# Patient Record
Sex: Female | Born: 1985 | Race: Black or African American | Hispanic: No | Marital: Single | Smoking: Never smoker
Health system: Southern US, Community
[De-identification: ages and names within clinical notes are randomized; demographics above are authoritative.]

## PROBLEM LIST (undated history)

## (undated) DIAGNOSIS — F32A Depression, unspecified: Secondary | ICD-10-CM

## (undated) DIAGNOSIS — F419 Anxiety disorder, unspecified: Secondary | ICD-10-CM

## (undated) DIAGNOSIS — Z9151 Personal history of suicidal behavior: Secondary | ICD-10-CM

## (undated) DIAGNOSIS — Z915 Personal history of self-harm: Secondary | ICD-10-CM

## (undated) DIAGNOSIS — F329 Major depressive disorder, single episode, unspecified: Secondary | ICD-10-CM

## (undated) HISTORY — PX: MOUTH SURGERY: SHX715

---
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
## 2005-06-03 ENCOUNTER — Other Ambulatory Visit: Admission: RE | Admit: 2005-06-03 | Discharge: 2005-06-03 | Payer: Self-pay | Admitting: Family Medicine

## 2005-06-03 ENCOUNTER — Ambulatory Visit: Payer: Self-pay | Admitting: Family Medicine

## 2005-09-04 ENCOUNTER — Emergency Department (HOSPITAL_COMMUNITY): Admission: EM | Admit: 2005-09-04 | Discharge: 2005-09-04 | Payer: Self-pay | Admitting: Family Medicine

## 2005-11-08 ENCOUNTER — Emergency Department (HOSPITAL_COMMUNITY): Admission: EM | Admit: 2005-11-08 | Discharge: 2005-11-08 | Payer: Self-pay | Admitting: Family Medicine

## 2006-08-24 ENCOUNTER — Ambulatory Visit: Payer: Self-pay | Admitting: Family Medicine

## 2006-09-02 ENCOUNTER — Ambulatory Visit: Payer: Self-pay | Admitting: Family Medicine

## 2006-09-03 ENCOUNTER — Encounter: Admission: RE | Admit: 2006-09-03 | Discharge: 2006-09-03 | Payer: Self-pay | Admitting: Family Medicine

## 2006-09-14 ENCOUNTER — Emergency Department (HOSPITAL_COMMUNITY): Admission: EM | Admit: 2006-09-14 | Discharge: 2006-09-14 | Payer: Self-pay | Admitting: Family Medicine

## 2006-09-28 ENCOUNTER — Ambulatory Visit: Payer: Self-pay | Admitting: Family Medicine

## 2006-09-30 ENCOUNTER — Ambulatory Visit: Payer: Self-pay | Admitting: Family Medicine

## 2006-11-12 ENCOUNTER — Emergency Department (HOSPITAL_COMMUNITY): Admission: EM | Admit: 2006-11-12 | Discharge: 2006-11-12 | Payer: Self-pay | Admitting: Family Medicine

## 2007-03-15 ENCOUNTER — Ambulatory Visit: Payer: Self-pay | Admitting: Family Medicine

## 2007-04-19 DIAGNOSIS — I10 Essential (primary) hypertension: Secondary | ICD-10-CM | POA: Insufficient documentation

## 2007-04-19 DIAGNOSIS — G039 Meningitis, unspecified: Secondary | ICD-10-CM | POA: Insufficient documentation

## 2007-06-30 ENCOUNTER — Ambulatory Visit: Payer: Self-pay | Admitting: Family Medicine

## 2007-08-26 ENCOUNTER — Emergency Department (HOSPITAL_COMMUNITY): Admission: EM | Admit: 2007-08-26 | Discharge: 2007-08-26 | Payer: Self-pay | Admitting: Family Medicine

## 2008-01-25 ENCOUNTER — Emergency Department (HOSPITAL_COMMUNITY): Admission: EM | Admit: 2008-01-25 | Discharge: 2008-01-25 | Payer: Self-pay | Admitting: Emergency Medicine

## 2008-10-31 ENCOUNTER — Emergency Department (HOSPITAL_COMMUNITY): Admission: EM | Admit: 2008-10-31 | Discharge: 2008-10-31 | Payer: Self-pay | Admitting: Emergency Medicine

## 2009-07-01 ENCOUNTER — Emergency Department (HOSPITAL_COMMUNITY): Admission: EM | Admit: 2009-07-01 | Discharge: 2009-07-01 | Payer: Self-pay | Admitting: Emergency Medicine

## 2009-08-19 ENCOUNTER — Telehealth (INDEPENDENT_AMBULATORY_CARE_PROVIDER_SITE_OTHER): Payer: Self-pay | Admitting: *Deleted

## 2010-10-19 ENCOUNTER — Emergency Department (HOSPITAL_COMMUNITY)
Admission: EM | Admit: 2010-10-19 | Discharge: 2010-10-20 | Payer: Self-pay | Source: Home / Self Care | Admitting: Emergency Medicine

## 2011-01-18 ENCOUNTER — Encounter: Payer: Self-pay | Admitting: Family Medicine

## 2011-02-23 ENCOUNTER — Emergency Department (HOSPITAL_COMMUNITY)
Admission: EM | Admit: 2011-02-23 | Discharge: 2011-02-23 | Disposition: A | Discharge status: Home / Self Care | Payer: PRIVATE HEALTH INSURANCE | Attending: Emergency Medicine | Admitting: Emergency Medicine

## 2011-02-23 ENCOUNTER — Emergency Department (HOSPITAL_COMMUNITY): Payer: PRIVATE HEALTH INSURANCE

## 2011-02-23 DIAGNOSIS — R110 Nausea: Secondary | ICD-10-CM | POA: Insufficient documentation

## 2011-02-23 DIAGNOSIS — E119 Type 2 diabetes mellitus without complications: Secondary | ICD-10-CM | POA: Insufficient documentation

## 2011-02-23 DIAGNOSIS — I10 Essential (primary) hypertension: Secondary | ICD-10-CM | POA: Insufficient documentation

## 2011-02-23 DIAGNOSIS — R071 Chest pain on breathing: Secondary | ICD-10-CM | POA: Insufficient documentation

## 2011-02-23 LAB — URINALYSIS, ROUTINE W REFLEX MICROSCOPIC
Bilirubin Urine: NEGATIVE
Ketones, ur: NEGATIVE mg/dL
Nitrite: NEGATIVE
Protein, ur: NEGATIVE mg/dL
Specific Gravity, Urine: 1.02 (ref 1.005–1.030)
Urobilinogen, UA: 1 mg/dL (ref 0.0–1.0)

## 2011-02-23 LAB — POCT PREGNANCY, URINE

## 2011-02-27 ENCOUNTER — Emergency Department (HOSPITAL_COMMUNITY): Payer: PRIVATE HEALTH INSURANCE

## 2011-02-27 ENCOUNTER — Emergency Department (HOSPITAL_COMMUNITY)
Admission: EM | Admit: 2011-02-27 | Discharge: 2011-02-27 | Disposition: A | Discharge status: Home / Self Care | Payer: PRIVATE HEALTH INSURANCE | Attending: Emergency Medicine | Admitting: Emergency Medicine

## 2011-02-27 ENCOUNTER — Encounter (HOSPITAL_COMMUNITY): Payer: Self-pay

## 2011-02-27 DIAGNOSIS — I10 Essential (primary) hypertension: Secondary | ICD-10-CM | POA: Insufficient documentation

## 2011-02-27 DIAGNOSIS — E119 Type 2 diabetes mellitus without complications: Secondary | ICD-10-CM | POA: Insufficient documentation

## 2011-02-27 DIAGNOSIS — R110 Nausea: Secondary | ICD-10-CM | POA: Insufficient documentation

## 2011-02-27 DIAGNOSIS — R51 Headache: Secondary | ICD-10-CM | POA: Insufficient documentation

## 2011-03-23 ENCOUNTER — Inpatient Hospital Stay (INDEPENDENT_AMBULATORY_CARE_PROVIDER_SITE_OTHER)
Admission: RE | Admit: 2011-03-23 | Discharge: 2011-03-23 | Disposition: A | Discharge status: Home / Self Care | Payer: PRIVATE HEALTH INSURANCE | Source: Ambulatory Visit | Attending: Emergency Medicine | Admitting: Emergency Medicine

## 2011-03-23 DIAGNOSIS — M799 Soft tissue disorder, unspecified: Secondary | ICD-10-CM

## 2011-04-05 LAB — POCT RAPID STREP A (OFFICE): Streptococcus, Group A Screen (Direct): NEGATIVE
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
## 2011-06-11 ENCOUNTER — Emergency Department (HOSPITAL_BASED_OUTPATIENT_CLINIC_OR_DEPARTMENT_OTHER)
Admission: EM | Admit: 2011-06-11 | Discharge: 2011-06-11 | Disposition: A | Discharge status: Home / Self Care | Payer: PRIVATE HEALTH INSURANCE | Attending: Emergency Medicine | Admitting: Emergency Medicine

## 2011-06-11 DIAGNOSIS — J020 Streptococcal pharyngitis: Secondary | ICD-10-CM | POA: Insufficient documentation

## 2011-06-11 DIAGNOSIS — I10 Essential (primary) hypertension: Secondary | ICD-10-CM | POA: Insufficient documentation

## 2011-06-30 ENCOUNTER — Emergency Department (HOSPITAL_COMMUNITY): Payer: PRIVATE HEALTH INSURANCE

## 2011-06-30 ENCOUNTER — Emergency Department (HOSPITAL_COMMUNITY)
Admission: EM | Admit: 2011-06-30 | Discharge: 2011-06-30 | Disposition: A | Discharge status: Home / Self Care | Payer: PRIVATE HEALTH INSURANCE | Attending: Emergency Medicine | Admitting: Emergency Medicine

## 2011-06-30 DIAGNOSIS — R079 Chest pain, unspecified: Secondary | ICD-10-CM | POA: Insufficient documentation

## 2011-06-30 DIAGNOSIS — Z79899 Other long term (current) drug therapy: Secondary | ICD-10-CM | POA: Insufficient documentation

## 2011-06-30 DIAGNOSIS — I10 Essential (primary) hypertension: Secondary | ICD-10-CM | POA: Insufficient documentation

## 2011-07-05 ENCOUNTER — Emergency Department (HOSPITAL_COMMUNITY)
Admission: EM | Admit: 2011-07-05 | Discharge: 2011-07-05 | Disposition: A | Discharge status: Home / Self Care | Payer: PRIVATE HEALTH INSURANCE | Attending: Emergency Medicine | Admitting: Emergency Medicine

## 2011-07-05 DIAGNOSIS — Y92009 Unspecified place in unspecified non-institutional (private) residence as the place of occurrence of the external cause: Secondary | ICD-10-CM | POA: Insufficient documentation

## 2011-07-05 DIAGNOSIS — S0990XA Unspecified injury of head, initial encounter: Secondary | ICD-10-CM | POA: Insufficient documentation

## 2011-07-05 DIAGNOSIS — W1809XA Striking against other object with subsequent fall, initial encounter: Secondary | ICD-10-CM | POA: Insufficient documentation

## 2011-07-05 DIAGNOSIS — W208XXA Other cause of strike by thrown, projected or falling object, initial encounter: Secondary | ICD-10-CM | POA: Insufficient documentation

## 2011-07-17 ENCOUNTER — Emergency Department (HOSPITAL_COMMUNITY)
Admission: EM | Admit: 2011-07-17 | Discharge: 2011-07-17 | Disposition: A | Discharge status: Home / Self Care | Payer: PRIVATE HEALTH INSURANCE | Attending: Emergency Medicine | Admitting: Emergency Medicine

## 2011-07-17 DIAGNOSIS — N644 Mastodynia: Secondary | ICD-10-CM | POA: Insufficient documentation

## 2011-07-17 DIAGNOSIS — X58XXXA Exposure to other specified factors, initial encounter: Secondary | ICD-10-CM | POA: Insufficient documentation

## 2011-07-17 DIAGNOSIS — S2000XA Contusion of breast, unspecified breast, initial encounter: Secondary | ICD-10-CM | POA: Insufficient documentation

## 2011-07-17 DIAGNOSIS — I10 Essential (primary) hypertension: Secondary | ICD-10-CM | POA: Insufficient documentation

## 2012-06-06 ENCOUNTER — Emergency Department (HOSPITAL_COMMUNITY)
Admission: EM | Admit: 2012-06-06 | Discharge: 2012-06-06 | Disposition: A | Discharge status: Home / Self Care | Payer: PRIVATE HEALTH INSURANCE | Attending: Emergency Medicine | Admitting: Emergency Medicine

## 2012-06-06 ENCOUNTER — Encounter (HOSPITAL_COMMUNITY): Payer: Self-pay | Admitting: Emergency Medicine

## 2012-06-06 ENCOUNTER — Emergency Department (HOSPITAL_COMMUNITY): Admission: EM | Admit: 2012-06-06 | Payer: Self-pay | Source: Home / Self Care

## 2012-06-06 DIAGNOSIS — H5710 Ocular pain, unspecified eye: Secondary | ICD-10-CM | POA: Insufficient documentation

## 2012-06-06 MED ORDER — FLUORESCEIN SODIUM 1 MG OP STRP: 2.0000 | ORAL_STRIP | Freq: Once | OPHTHALMIC | Status: DC

## 2012-06-06 MED ORDER — TETRACAINE HCL 0.5 % OP SOLN: 2.0000 [drp] | Freq: Once | OPHTHALMIC | Status: DC

## 2012-06-06 NOTE — ED Notes (Signed)
Pt c/o eye pain that began "10-15 minutes ago". Pt states she suddenly felt a sharp pain in left eye. Pt looked in mirror and eye was blood shot. Pt states the redness has gone down but the pain is still present.

## 2012-06-06 NOTE — Discharge Instructions (Signed)
You were seen and evaluated for your complaints of left eye pain. Your providers today were not able to find any cause for your eye pain. There were no signs for any emergent condition causing your eye pain. At this time your providers feel you may return home and followup with an ophthalmology specialist. Please call Dr. Ephriam Knuckles office later today to schedule close followup appointment. If you are unable to get appointment or your symptoms worsen, you have changing or loss of vision, you have increased redness of the eye at work or discharge please return to the emergency room.   Pain of Unknown Etiology (Pain Without a Known Cause) You have come to your caregiver because of pain. Pain can occur in any part of the body. Often there is not a definite cause. If your laboratory (blood or urine) work was normal and x-rays or other studies were normal, your caregiver may treat you without knowing the cause of the pain. An example of this is the headache. Most headaches are diagnosed by taking a history. This means your caregiver asks you questions about your headaches. Your caregiver determines a treatment based on your answers. Usually testing done for headaches is normal. Often testing is not done unless there is no response to medications. Regardless of where your pain is located today, you can be given medications to make you comfortable. If no physical cause of pain can be found, most cases of pain will gradually leave as suddenly as they came.  If you have a painful condition and no reason can be found for the pain, It is importantthat you follow up with your caregiver. If the pain becomes worse or does not go away, it may be necessary to repeat tests and look further for a possible cause.  Only take over-the-counter or prescription medicines for pain, discomfort, or fever as directed by your caregiver.   For the protection of your privacy, test results can not be given over the phone. Make sure you  receive the results of your test. Ask as to how these results are to be obtained if you have not been informed. It is your responsibility to obtain your test results.   You may continue all activities unless the activities cause more pain. When the pain lessens, it is important to gradually resume normal activities. Resume activities by beginning slowly and gradually increasing the intensity and duration of the activities or exercise. During periods of severe pain, bed-rest may be helpful. Lay or sit in any position that is comfortable.   Ice used for acute (sudden) conditions may be effective. Use a large plastic bag filled with ice and wrapped in a towel. This may provide pain relief.   See your caregiver for continued problems. They can help or refer you for exercises or physical therapy if necessary.  If you were given medications for your condition, do not drive, operate machinery or power tools, or sign legal documents for 24 hours. Do not drink alcohol, take sleeping pills, or take other medications that may interfere with treatment. See your caregiver immediately if you have pain that is becoming worse and not relieved by medications. Document Released: 09/08/2001 Document Revised: 12/03/2011 Document Reviewed: 12/14/2005 [facility] Patient Information 2012 [place], [place].    RESOURCE GUIDE  Chronic Pain Problems: Contact [facility]  [phone] Patients need to be referred by their primary care doctor.  Insufficient Money for Medicine: Contact United Way:  call "211" or [facility] (212)821-4758.  No Primary Care Doctor: - Call [facility] Connect  ([phone] - can help you locate a primary care doctor that  accepts your insurance, provides certain services, etc. - Physician Referral Service- [phone]  Agencies that provide inexpensive medical care: - [facility]  [phone] - [facility]  [phone] - [facility]  [phone] - Women's Clinic  [phone] - Planned Parenthood  [phone] [facility]  [phone]  Medicaid-accepting [place] [facility] Providers: - [facility]- [address]  [phone], Mon-Fri 9am-7pm, Sat 9am-1pm - [facility]- [address] [place], [address] [place]  [phone] - [facility]- [address] [place]  [phone] [facility]- [address]  [phone] - Renaye Rakers- [address] [place], [address], [phone]  Only accepts [place] patients after they have their name  applied to their card  Self Pay (no insurance) in [place]: - Sickle Cell Patients: Dr Willey Blade, [facility]  [address] [place], [phone] - [facility]- [address] [place]  [phone]       [facility] [place]- [address]       -     [facility]- see information above (Speak to [employer] if you do not have insurance)       -  [facility]- [address] [place], [phone]       -  [facility]- [address],  [phone]       -  [facility]- [address] [phone]       -  Dr Julio Sicks-  [address], [place], [phone]       -  [facility]- [address] [phone]       -  [facility]- [address] [phone], also [address] [phone]       -    [facility]- [address] [place], [phone], 1st & 3rd Saturday   every month, 10am-1pm  1) Find a Doctor and Pay Out of Pocket Although you won't have to find out who is covered by your insurance plan, it is a good idea to ask around and get recommendations. You will then need to call the office and see if the doctor you have chosen will accept you as a new patient and what types of options they offer for patients who are self-pay. Some doctors offer discounts or will  set up payment plans for their patients who do not have insurance, but you will need to ask so you aren't surprised when you get to your appointment.  2) Contact Your Local Health Department Not all health departments have doctors that can see patients for sick visits, but many do, so it is worth a call to see if yours does. If you don't know where your local health department is, you can check in your phone book. The CDC also has a tool to help you locate your state's health department, and many state websites also have listings of all of their local health departments.  3) Find a Walk-in Clinic If your illness is not likely to be very severe or complicated, you may want to try a walk in clinic. These are popping up all  over the country in pharmacies, drugstores, and shopping centers. They're usually staffed by nurse practitioners or physician assistants that have been trained to treat common illnesses and complaints. They're usually fairly quick and inexpensive. However, if you have serious medical issues or chronic medical problems, these are probably not your best option  STD Testing - [facility] Department of [facility], STD Clinic, [address], [place], phone [phone] or [phone].  Monday - Friday, call for an appointment. [facility] Department of [employer], STD Clinic, [address], [place], phone [phone] or [phone].  Monday - Friday, call for an appointment.  Abuse/Neglect: [facility] Child Abuse Hotline 8677307919 [facility] Child Abuse Hotline 4181316185 (After Hours)  Emergency Shelter:  [facility] (314)562-8747  Maternity Homes: - [facility] at [facility] [place] [facility] (747)535-1293 - [facility] (684)774-5307  MRSA Hotline #:   978-247-0040  [facility] Resources  [facility] [place]  United Way [facility] Dept. [address]                  [address]         [address] [place] [address]  Blondell Reveal Phone:  [phone]                                  Phone:  [phone]                   Phone:  [phone]  [facility] Mental Health, [phone] - [phone]       -     [facility] in [place], [address],                                  [phone], [facility] Child Abuse Hotline (279)601-2377 or 657-438-3328 (After Hours)   Behavioral Health Services  Substance Abuse Resources: - Alcohol and Drug Services  303-285-8067 - [facility] 856-245-5375 - [facility] 4702844074 [place]ene Flock [phone] - Residential & Outpatient Substance Abuse Program  [phone]  Psychological Services: [facility]  (775)[phone] Services  ([phone] - [facility], [phone] [place]. [address], [place], ACCESS LINE: (716)845-1288 or 470-706-2365, [employer]za  Dental Assistance  If unable to pay or uninsured, contact:  [facility] or [facility]. to become qualified for the adult dental clinic.  Patients with Medicaid: [facility] (424)[phone] [address], [phone] [address], [phone]  If unable to pay, or uninsured, contact [facility] 484-197-5124) or [facility] Department [phone] in [place], [phone] in [facility]) to become qualified for the adult dental clinic  Other Proofreader Services: - [facility]- [address], [place]  [place], [place], [address], [phone], [phone], 2nd and 4th Thursday of the month at 6:30am.  10 clients each day by appointment, can sometimes see walk-in patients if someone does not show for an appointment. [facility]-  [address] Ether Griffins [place], [place], [address], [phone] - [facility]- [address], [place], [place], [address], [phone] - [facility]- [phone] [facility] Health Department- [phone] [facility]- [phone]

## 2012-06-06 NOTE — ED Provider Notes (Signed)
Medical screening examination/treatment/procedure(s) were performed by non-physician practitioner and as supervising physician I was immediately available for consultation/collaboration.  Olivia Mackie, MD 06/06/12 [phone]

## 2012-06-06 NOTE — ED Notes (Signed)
Pt states that approximately 1/2 hour ago she was walking and felt a sharp stabbing pain in her Lt eye and her eye immediately became bloodshot. Pt states that the redness has cleared; however, the pain still remains. Pt denies any trauma or chemical exposure or smoke exposure. Pt denies any vision changes. "It just hurts."

## 2012-06-06 NOTE — ED Notes (Signed)
MD at bedside. 

## 2012-06-06 NOTE — ED Notes (Signed)
Eye care box containing tetracaine drops & fluorecein strips placed at bedside.

## 2012-06-06 NOTE — ED Provider Notes (Signed)
History     CSN: [id number]  Arrival date & time 06/06/12  0203   First MD Initiated Contact with Patient 06/06/12 [id number]      Chief Complaint  Patient presents with  . Eye Pain    HPI  History provided by the patient. Patient is a 26 year old female with no significant past medical history who presents with complaints of acute onset left eye pain and redness. Patient states symptoms began just prior to arrival to the emergency room. Patient was walking outside with friends and suddenly had sharp pains to the left eye. Patient states she looked in the mirror and noticed redness to the lateral aspect of the high states he was very "bloodshot". This has improved the patient continues to have some pain to the eye. She denies any visual change. There is no blurriness or loss of vision. Patient denies having any foreign body too high. She denies any itching or scratching to the eye. She denies any similar symptoms previously. She has not done anything to help with symptoms. She denies any other aggravating or alleviating factors. Symptoms are described as moderate.    History reviewed. No pertinent past medical history.  History reviewed. No pertinent past surgical history.  History reviewed. No pertinent family history.  History  Substance Use Topics  . Smoking status: Not on file  . Smokeless tobacco: Not on file  . Alcohol Use: Not on file    OB History    Grav Para Term Preterm Abortions TAB SAB Ect Mult Living                  Review of Systems  Eyes: Positive for photophobia, pain and redness. Negative for discharge, itching and visual disturbance.  Gastrointestinal: Negative for nausea.  Neurological: Negative for dizziness, light-headedness and headaches.    Allergies  Review of patient's allergies indicates no known allergies.  Home Medications  No current outpatient prescriptions on file.  BP 116/74  Pulse 67  Temp(Src) 98.4 F (36.9 C) (Oral)  Resp 18  SpO2  99%  Physical Exam  Nursing note and vitals reviewed. Constitutional: She is oriented to person, place, and time. She appears well-developed and well-nourished. No distress.  HENT:  Head: Normocephalic and atraumatic.  Mouth/Throat: Oropharynx is clear and moist.  Eyes: Conjunctivae, EOM and lids are normal. Pupils are equal, round, and reactive to light. No foreign bodies found. Right eye exhibits no chemosis and no discharge. No foreign body present in the right eye. Left eye exhibits no chemosis and no discharge. No foreign body present in the left eye. Right conjunctiva is not injected. Right conjunctiva has no hemorrhage. Left conjunctiva is not injected. Left conjunctiva has no hemorrhage.  Slit lamp exam:      The right eye shows no corneal abrasion, no corneal ulcer, no foreign body, no hyphema, no fluorescein uptake and no anterior chamber bulge.       The left eye shows no corneal abrasion, no corneal ulcer, no foreign body, no hyphema, no fluorescein uptake and no anterior chamber bulge.       Ocular pressures were:  Right eye: 17 and 17  Left eye: 20, 16 and 16  Neck: Normal range of motion. Neck supple.  Cardiovascular: Normal rate and regular rhythm.   Pulmonary/Chest: Effort normal and breath sounds normal.  Neurological: She is alert and oriented to person, place, and time.  Skin: Skin is warm and dry.  Psychiatric: She has a normal mood and affect.  Her behavior is normal.    ED Course  Procedures      1. Eye pain       MDM  Patient seen and evaluated. Patient in no acute distress.   No concerning findings on exam to explain patient's pain and symptoms. Will give ophthalmology referral for patient to followup with today if symptoms persist.    Angus Seller, PA 06/06/12 [phone]

## 2012-10-24 ENCOUNTER — Encounter (HOSPITAL_COMMUNITY): Payer: Self-pay | Admitting: Emergency Medicine

## 2012-10-24 ENCOUNTER — Emergency Department (HOSPITAL_COMMUNITY)
Admission: EM | Admit: 2012-10-24 | Discharge: 2012-10-25 | Disposition: A | Discharge status: Other Acute Inpatient Hospital | Payer: PRIVATE HEALTH INSURANCE | Attending: Emergency Medicine | Admitting: Emergency Medicine

## 2012-10-24 DIAGNOSIS — F3289 Other specified depressive episodes: Secondary | ICD-10-CM | POA: Insufficient documentation

## 2012-10-24 DIAGNOSIS — F329 Major depressive disorder, single episode, unspecified: Secondary | ICD-10-CM | POA: Insufficient documentation

## 2012-10-24 HISTORY — DX: Personal history of self-harm: Z91.5

## 2012-10-24 HISTORY — DX: Personal history of suicidal behavior: Z91.51

## 2012-10-24 LAB — CBC
HCT: 38.5 % (ref 36.0–46.0)
Hemoglobin: 13 g/dL (ref 12.0–15.0)
MCH: 26.1 pg (ref 26.0–34.0)
MCHC: 33.8 g/dL (ref 30.0–36.0)
MCV: 77.2 fL — ABNORMAL LOW (ref 78.0–100.0)
RBC: 4.99 MIL/uL (ref 3.87–5.11)

## 2012-10-24 LAB — COMPREHENSIVE METABOLIC PANEL
BUN: 10 mg/dL (ref 6–23)
CO2: 26 mEq/L (ref 19–32)
Calcium: 9.6 mg/dL (ref 8.4–10.5)
Creatinine, Ser: 0.71 mg/dL (ref 0.50–1.10)
GFR calc Af Amer: >90 mL/min (ref >90)
GFR calc non Af Amer: >90 mL/min (ref >90)
Glucose, Bld: 125 mg/dL — ABNORMAL HIGH (ref 70–99)
Total Protein: 8.5 g/dL — ABNORMAL HIGH (ref 6.0–8.3)

## 2012-10-24 LAB — RAPID URINE DRUG SCREEN, HOSP PERFORMED
Barbiturates: NOT DETECTED
Benzodiazepines: NOT DETECTED
Cocaine: NOT DETECTED

## 2012-10-24 LAB — ETHANOL: Alcohol, Ethyl (B): <11 mg/dL (ref 0–11)

## 2012-10-24 MED ORDER — ONDANSETRON HCL 4 MG PO TABS: 4.0000 mg | ORAL_TABLET | Freq: Three times a day (TID) | ORAL | Status: DC | PRN

## 2012-10-24 MED ORDER — ZOLPIDEM TARTRATE 5 MG PO TABS: 5.0000 mg | ORAL_TABLET | Freq: Every evening | ORAL | Status: DC | PRN

## 2012-10-24 MED ORDER — LORAZEPAM 1 MG PO TABS: 1.0000 mg | ORAL_TABLET | Freq: Three times a day (TID) | ORAL | Status: DC | PRN

## 2012-10-24 MED ORDER — ALUM & MAG HYDROXIDE-SIMETH 200-200-20 MG/5ML PO SUSP: 30.0000 mL | ORAL | Status: DC | PRN

## 2012-10-24 MED ORDER — NICOTINE 21 MG/24HR TD PT24
21.0000 mg | MEDICATED_PATCH | Freq: Every day | TRANSDERMAL | Status: DC
  Filled 2012-10-24: qty 1

## 2012-10-24 MED ORDER — IBUPROFEN 600 MG PO TABS: 600.0000 mg | ORAL_TABLET | Freq: Three times a day (TID) | ORAL | Status: DC | PRN

## 2012-10-24 MED ORDER — ACETAMINOPHEN 325 MG PO TABS: 650.0000 mg | ORAL_TABLET | ORAL | Status: DC | PRN

## 2012-10-24 NOTE — ED Notes (Signed)
Pt presenting to ed with c/o suicidal ideation pt brought to ED with counselor from [facility]. Pt states her plan with to take a clorox and ammonia mixture. Pt is alert and oriented at this time. Per counselor pt also started writing notes to her family.
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
## 2012-10-24 NOTE — ED Notes (Signed)
Called specialist on call for telepsych for pt. Awaiting MD call back.

## 2012-10-24 NOTE — ED Notes (Signed)
Pt with telepsych being performed.

## 2012-10-25 ENCOUNTER — Encounter (HOSPITAL_COMMUNITY): Payer: Self-pay | Admitting: *Deleted

## 2012-10-25 ENCOUNTER — Inpatient Hospital Stay (HOSPITAL_COMMUNITY)
Admission: AD | Admit: 2012-10-25 | Discharge: 2012-10-28 | DRG: 881 | Disposition: A | Discharge status: Home / Self Care | Payer: PRIVATE HEALTH INSURANCE | Source: Ambulatory Visit | Attending: Psychiatry | Admitting: Psychiatry

## 2012-10-25 DIAGNOSIS — R45851 Suicidal ideations: Secondary | ICD-10-CM

## 2012-10-25 DIAGNOSIS — R12 Heartburn: Secondary | ICD-10-CM | POA: Diagnosis present

## 2012-10-25 DIAGNOSIS — K5900 Constipation, unspecified: Secondary | ICD-10-CM | POA: Diagnosis present

## 2012-10-25 DIAGNOSIS — F3289 Other specified depressive episodes: Principal | ICD-10-CM | POA: Diagnosis present

## 2012-10-25 DIAGNOSIS — F329 Major depressive disorder, single episode, unspecified: Secondary | ICD-10-CM

## 2012-10-25 DIAGNOSIS — F32A Depression, unspecified: Secondary | ICD-10-CM

## 2012-10-25 LAB — URINALYSIS, ROUTINE W REFLEX MICROSCOPIC
Bilirubin Urine: NEGATIVE
Glucose, UA: NEGATIVE mg/dL
Hgb urine dipstick: NEGATIVE
Specific Gravity, Urine: 1.027 (ref 1.005–1.030)
pH: 6 (ref 5.0–8.0)

## 2012-10-25 LAB — URINE MICROSCOPIC-ADD ON

## 2012-10-25 MED ORDER — POTASSIUM CHLORIDE CRYS ER 20 MEQ PO TBCR
EXTENDED_RELEASE_TABLET | ORAL | Status: AC
  Administered 2012-10-25: 40 meq via ORAL
  Filled 2012-10-25: qty 2

## 2012-10-25 MED ORDER — ACETAMINOPHEN 325 MG PO TABS: 650.0000 mg | ORAL_TABLET | Freq: Four times a day (QID) | ORAL | Status: DC | PRN

## 2012-10-25 MED ORDER — NICOTINE 21 MG/24HR TD PT24
21.0000 mg | MEDICATED_PATCH | Freq: Every day | TRANSDERMAL | Status: DC
  Filled 2012-10-25: qty 1

## 2012-10-25 MED ORDER — HYDROXYZINE HCL 25 MG PO TABS
25.0000 mg | ORAL_TABLET | Freq: Every evening | ORAL | Status: DC | PRN
  Administered 2012-10-25 – 2012-10-27 (×3): 25 mg via ORAL

## 2012-10-25 MED ORDER — HYDROXYZINE HCL 25 MG PO TABS
25.0000 mg | ORAL_TABLET | Freq: Three times a day (TID) | ORAL | Status: DC | PRN
  Administered 2012-10-27: 25 mg via ORAL

## 2012-10-25 MED ORDER — SULFAMETHOXAZOLE-TMP DS 800-160 MG PO TABS
1.0000 | ORAL_TABLET | Freq: Two times a day (BID) | ORAL | Status: DC
  Administered 2012-10-25 (×2): 1 via ORAL
  Filled 2012-10-25 (×2): qty 1

## 2012-10-25 MED ORDER — SULFAMETHOXAZOLE-TRIMETHOPRIM 400-80 MG PO TABS: 2.0000 | ORAL_TABLET | Freq: Two times a day (BID) | ORAL | Status: DC

## 2012-10-25 MED ORDER — SULFAMETHOXAZOLE-TMP DS 800-160 MG PO TABS
1.0000 | ORAL_TABLET | Freq: Two times a day (BID) | ORAL | Status: AC
  Administered 2012-10-25 – 2012-10-28 (×6): 1 via ORAL
  Filled 2012-10-25 (×7): qty 1

## 2012-10-25 MED ORDER — MAGNESIUM HYDROXIDE 400 MG/5ML PO SUSP: 30.0000 mL | Freq: Every day | ORAL | Status: DC | PRN

## 2012-10-25 MED ORDER — POTASSIUM CHLORIDE CRYS ER 20 MEQ PO TBCR
40.0000 meq | EXTENDED_RELEASE_TABLET | Freq: Once | ORAL | Status: AC
  Administered 2012-10-25 (×2): 40 meq via ORAL

## 2012-10-25 MED ORDER — ALUM & MAG HYDROXIDE-SIMETH 200-200-20 MG/5ML PO SUSP: 30.0000 mL | ORAL | Status: DC | PRN

## 2012-10-25 NOTE — Progress Notes (Signed)
Patient ID: Cathy Manning, female   DOB: 05/18/1986, 26 y.o.   MRN: [id number] Patient admitted to [facility].  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  Patient became depressed at school and went to see school counselor; she felt she could not take her test and felt overwhelmed.  Patient had recently broke up with her boyfriend whom she states is a "jerk".  She expressed SI with a plan to "drink ammonia."  Patient has had prior SI in the past with no attempts.  She lives in an apartment with a friend.  She denies any HI or AVH.  She had depressed and irritable mood at this time.  Patient oriented to room and unit.  She is a voluntary admission.

## 2012-10-25 NOTE — ED Provider Notes (Signed)
Patient c/o strong smell to urine, ua with probable UTI.  Will start bactrim.  Olivia Mackie, MD 10/25/12 [phone]

## 2012-10-25 NOTE — Progress Notes (Signed)
[facility] Group Notes:  (Counselor/Nursing/MHT/Case Management/Adjunct)  10/25/2012 9:51 PM  Type of Therapy:  Psychoeducational Skills  Participation Level:  Active  Participation Quality:  Appropriate  Affect:  Appropriate  Cognitive:  Alert  Insight:  Good  Engagement in Group:  Good  Engagement in Therapy:  Good  Modes of Intervention:  Problem-solving  Summary of Progress/Problems: Cathy Manning shared her feeling with the group.  Cathy Manning is experiencing a heart break from her significant other.     [name] [place] 10/25/2012, 9:51 PM

## 2012-10-25 NOTE — ED Notes (Signed)
Called report to Behavioral Health.

## 2012-10-25 NOTE — ED Provider Notes (Signed)
Pt stable awaiting placement  Benny Lennert, MD 10/25/12 [phone]

## 2012-10-25 NOTE — ED Notes (Signed)
Upon entering room. Pt had paper scrubs off. Pt instructed to put paper scrubs back on. Pt put scrubs back on. Will continue to monitor.

## 2012-10-25 NOTE — Progress Notes (Signed)
New admit to the unit this evening.  Pt is here for depression with SI d/t school stress and recent break-up with boyfriend.  Pt is a Dietitian.  Pt said during her admission assessment that she was addicted to sex.  MHT reported that pt mostly talked about sex and her problems with partners during the group time.  Her conversation had to be redirected more than once during that time.  She denies SI/HI/AV at this time.  She was started on Bactrim for a UTI and given supplemental K+ for a low level of 3.2.  Pt has been appropriate with this Clinical research associate.  She has been encouraged to make her needs known to staff.  Pt voices understanding.  Pt has no needs/concerns at this time.  Safety maintained with q15 minute checks.

## 2012-10-25 NOTE — ED Notes (Signed)
Pt informed staff that she had a discharge that had order. MD informed.

## 2012-10-25 NOTE — ED Provider Notes (Signed)
  Physical Exam  BP 151/90  Pulse 66  Temp 98.3 F (36.8 C) (Oral)  Resp 18  SpO2 100%  LMP 10/03/2012  Physical Exam  ED Course  Procedures  MDM Telepsych has requested inpatient treatment      Harrold Donath R. Rubin Payor, MD 10/25/12 1610

## 2012-10-25 NOTE — ED Notes (Signed)
Pt's mother came to the window requesting that pt contact her via phone.

## 2012-10-26 DIAGNOSIS — F329 Major depressive disorder, single episode, unspecified: Principal | ICD-10-CM

## 2012-10-26 LAB — BASIC METABOLIC PANEL
CO2: 28 mEq/L (ref 19–32)
Chloride: 101 mEq/L (ref 96–112)
GFR calc non Af Amer: 85 mL/min — ABNORMAL LOW (ref >90)
Glucose, Bld: 84 mg/dL (ref 70–99)
Potassium: 3.6 mEq/L (ref 3.5–5.1)
Sodium: 138 mEq/L (ref 135–145)

## 2012-10-26 LAB — GLUCOSE, CAPILLARY: Glucose-Capillary: 152 mg/dL — ABNORMAL HIGH (ref 70–99)

## 2012-10-26 MED ORDER — SERTRALINE HCL 25 MG PO TABS
25.0000 mg | ORAL_TABLET | Freq: Every day | ORAL | Status: DC
  Administered 2012-10-26 – 2012-10-28 (×3): 25 mg via ORAL
  Filled 2012-10-26 (×5): qty 1

## 2012-10-26 MED ORDER — IBUPROFEN 200 MG PO TABS: 400.0000 mg | ORAL_TABLET | Freq: Four times a day (QID) | ORAL | Status: DC | PRN

## 2012-10-26 NOTE — Progress Notes (Signed)
Psychoeducational Group Note  Date:  10/26/2012 Time: 2000  Group Topic/Focus:  Wrap-Up Group:   The focus of this group is to help patients review their daily goal of treatment and discuss progress on daily workbooks.  Participation Level:  Minimal  Participation Quality:  Attentive, Resistant and Sharing  Affect:  Blunted  Cognitive:  Appropriate  Insight:  Limited  Engagement in Group:  Limited  Additional Comments:  Patient shared that she wanted to go home and wanted to talk with the doctor about that once again tomorrow.  Denelda Akerley, [place] Pigg 10/26/2012, 10:14 PM

## 2012-10-26 NOTE — Progress Notes (Signed)
D: Patient cooperative with staff and peers, but anxious at times. Patient reported on self inventory sheet that her energy level is normal and ability to pay attention is good. She rated depression an "8" and feelings of hopelessness "1". Her goal today is to not let anyone drag her down.  A: Support and encouragement provided to patient. Administered scheduled medications per MD orders. Maintain 15 minute checks for safety.  R: Patient receptive and compliant with scheduled medication regimen. Denies SI/HI/AVH. Patient remains safe.

## 2012-10-26 NOTE — Progress Notes (Signed)
Psychoeducational Group Note  Date:  10/26/2012 Time:  1100  Group Topic/Focus:  Personal Choices and Values:   The focus of this group is to help patients assess and explore the importance of values in their lives, how their values affect their decisions, how they express their values and what opposes their expression.  Participation Level: Did Not Attend  Participation Quality:  Not Applicable  Affect:  Not Applicable  Cognitive:  Not Applicable  Insight:  Not Applicable  Engagement in Group: Not Applicable  Additional Comments: Patient did not attend group.   Karleen Hampshire Brittini 10/26/2012, 1:55 PM
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
## 2012-10-26 NOTE — Progress Notes (Signed)
[facility] Group Notes:  (Counselor/Nursing/MHT/Case Management/Adjunct) 1:15-2:30 Emotion Regulation Group  10/26/2012 3:23 PM  Type of Therapy:  Group Therapy  Participation Level:  Minimal  Participation Quality:  Drowsy  Affect:  Appropriate  Cognitive:  Appropriate and Oriented  Insight:  Good  Engagement in Group:  Limited  Engagement in Therapy:  Limited  Modes of Intervention:  Education, Problem-solving and Support  Summary of Progress/Problems: Patient appeared drowsy during group interaction. Her interaction and engagement with the conversation were limited.   Geffrey Michaelsen L 10/26/2012, 3:23 PM

## 2012-10-26 NOTE — Tx Team (Signed)
Interdisciplinary Treatment Plan Update (Adult)  Date:  10/26/2012  Time Reviewed:  9:57 AM   Progress in Treatment: Attending groups:   Yes   Participating in groups:  Yes Taking medication as prescribed:  Yes Tolerating medication:  Yes Family/Significant othe contact made: Patient to consider who she will allow to be a collateral contact Patient understands diagnosis:  Yes Discussing patient identified problems/goals with staff: Yes Medical problems stabilized or resolved: Yes Denies suicidal/homicidal ideation:Yes Issues/concerns per patient self-inventory:  Other:  New problem(s) identified:  Reason for Continuation of Hospitalization: Anxiety Depression Medication stabilization  Interventions implemented related to continuation of hospitalization:  Medication Management; safety checks q 15 mins  Additional comments:  Estimated length of stay:  Discharge Plan:  New goal(s):  Review of initial/current patient goals per problem list:    1.  Goal(s): Eliminate SI/other thoughts of self harm   Met:  Yes  Target date: d/c  As evidenced by: Patient no longer endorsing SI/HI or other thoughts of self harm.    2.  Goal (s): Reduce depression/anxiety (rated at five)   Met:  No  Target date: d/c  As evidenced by: Patient will rate symptoms at four or below    3.  Goal(s): .stabilize on meds   Met:  No  Target date: d/c  As evidenced by: Patient reports being stabilized on medications - less symptomatic    4.  Goal(s): Refer for outpatient follow up   Met: No  Target date:  D/c  As evidenced by: Follow up appointment will be scheduled    Attendees: Patient:  Cathy Manning 10/26/2012 9:57 AM  Physican:  Patrick North, MD 10/26/2012 9:57 AM  Nursing:   10/26/2012 9:57 AM   Nursing:    10/26/2012 9:57 AM   Clinical Social Worker:  Juline Patch, LCSW 10/26/2012 9:57 AM   Other: 10/26/2012 9:57 AM

## 2012-10-26 NOTE — [facility] Suicide Risk Assessment (Signed)
Suicide Risk Assessment  Admission Assessment     Nursing information obtained from:    Demographic factors:   Patient is a 26 yo AAF, single Current Mental Status:  Patient alert and oriented to 4. Having some passive suicidal thoughts.  Loss Factors:   Loss of relationship Historical Factors:   History of previous suicidal thoughts, poor choices in relationships. Risk Reduction Factors: Mom supportive, patient has goals to be in the [employer]  and complete college.  CLINICAL FACTORS:   Depression:   Anhedonia Impulsivity  COGNITIVE FEATURES THAT CONTRIBUTE TO RISK:  Closed-mindedness    SUICIDE RISK:   Moderate:  Frequent suicidal ideation with limited intensity, and duration, some specificity in terms of plans, no associated intent, good self-control, limited dysphoria/symptomatology, some risk factors present, and identifiable protective factors, including available and accessible social support.  PLAN OF CARE: Start patient on antidepressant medication, encourage attending groups. Monitor closely.   Brenisha Tsui 10/26/2012, 9:56 AM

## 2012-10-27 LAB — URINE CULTURE: Colony Count: >=100000

## 2012-10-27 NOTE — Progress Notes (Deleted)
[facility] Case Management Discharge Plan:  Will you be returning to the same living situation after discharge: Yes,  Patient will be returing to her home. At discharge, do you have transportation home?:Yes,  Patients family will provide transportation. Do you have the ability to pay for your medications:Yes,  Patient is insured.  Interagency Information:     Release of information consent forms completed and in the chart;  Patient's signature needed at discharge.  Patient to Follow up at:  Follow-up Information    Follow up with [facility] . On 11/03/2012. (Patient has an appointment with Dr. Nolen Mu on Thursday, November 03, 2012 at 2pm.)    Contact information:   [address], [place], [place] [address]     [phone]           Patient denies SI/HI:   Yes,  Patient is not endorsing SI/HI.    Safety Planning and Suicide Prevention discussed:  Yes,  Safety planning and suicide prevention were discussed with patient during d/c planning group.  Barrier to discharge identified:No. None identified at this time.  Summary and Recommendations:  Mendell Bontempo L 10/27/2012, 11:44 AM

## 2012-10-27 NOTE — Progress Notes (Signed)
Psychoeducational Group Note  Date:  10/27/2012 Time:  2100   Group Topic/Focus:  Karaoke   Participation Level:  Active  Participation Quality:  Appropriate  Affect:  Appropriate  Cognitive:  Alert  Insight:  Good  Engagement in Group:  Good  Additional Comments:    Humberto Seals Monique 10/27/2012, 9:59 PM

## 2012-10-27 NOTE — Progress Notes (Addendum)
[facility] Group Notes:  (Counselor/Nursing/MHT/Case Management/Adjunct)  10/27/2012 3:36 PM  Type of Therapy:  Psychoeducational Skills  Participation Level:  Active  Participation Quality:  Appropriate, Attentive and Sharing  Affect:  Appropriate  Cognitive:  Oriented  Insight:  Limited  Engagement in Group:  Good  Engagement in Therapy:  n./a  Modes of Intervention:  Activity, Education, Problem-solving, Socialization and Support  Summary of Progress/Problems: Ryna attended Psychoeducational group on labels. 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 Silvia was active while group discussed what labels are, how we use them, how they effect our way of thinking, and listed positive and negative labels they have had. Lisaann was given a homework assignment to list 10 ways she has been labeled and to find the reality of the situation/label. Patient sat in group in hospital gowns with legs and thighs showing at times, shaking or rolling legs around appeared as though drawing attention,was redirected to sit in a different position.   Wandra Scot 10/27/2012, 3:36 PM

## 2012-10-27 NOTE — Progress Notes (Signed)
Pt observed in the dayroom watching TV and eating a snack after group.  Pt states she is ready to go home, and wanted to know who she needs to talk with about discharge.  Informed pt that the MD is the one who determines whether a pt is ready for discharge and that would not be until the morning.  Pt denies SI/HI/AV.  She voices no needs/concerns other than wanting to go home.  Pt feels she can go back to school and deal with her stressors.  Support/encouragement given.  Safety maintained with q15 minute checks.

## 2012-10-27 NOTE — Progress Notes (Signed)
[facility] Group Notes:  (Counselor/Nursing/MHT/Case Management/Adjunct)   Living a Balanced Life 1:15-2:30   Discharge Planning Group 8:30-9:30   10/27/2012 3:00 PM  Type of Therapy:  Group Therapy  Participation Level:  Minimal  Participation Quality:  Drowsy and Inattentive  Affect:  Appropriate  Cognitive:  Appropriate and Oriented  Insight:  None  Engagement in Group:  Limited  Engagement in Therapy:  None  Modes of Intervention:  Education and Support  Summary of Progress/Problems: Patient was inattentive during group.  When asked by speaker she identified her strengths as being loyal and protective of her friends.  She fell asleep several times during group session.  D/C Planning Group: Patient denies SI/HI. She rates depression at one/two and all other symptoms at zero.  Patient communicates that she is ready for discharge and does not want to miss halloween.    Castulo Scarpelli L 10/27/2012, 3:00 PM

## 2012-10-27 NOTE — Progress Notes (Signed)
Psychoeducational Group Note  Date:  10/27/2012 Time:  1000  Group Topic/Focus:  Wellness Toolbox:   The focus of this group is to discuss various aspects of wellness, balancing those aspects and exploring ways to increase the ability to experience wellness.  Patients will create a wellness toolbox for use upon discharge.  Participation Level:  Active  Participation Quality:  Appropriate, Sharing and Supportive  Affect:  Appropriate  Cognitive:  Alert and Appropriate  Insight:  Good  Engagement in Group:  Good  Additional Comments:  Productive group  Earline Mayotte 10/27/2012, 6:12 PM

## 2012-10-27 NOTE — Progress Notes (Signed)
Psychoeducational Group Note  Date:  10/27/2012 Time: 1100  Group Topic/Focus:  Rediscovering Joy:   The focus of this group is to explore various ways to relieve stress in a positive manner.  Participation Level:  Minimal  Participation Quality:  Inattentive and Redirectable  Affect:  Blunted and Irritable  Cognitive:  Alert  Insight:  None  Engagement in Group:  None  Additional Comments:  Patient left group due to being upset from information given in treatment team in regards to not being not discharged. Karleen Hampshire Brittini 10/27/2012, 3:05 PM

## 2012-10-27 NOTE — Social Work (Signed)
Patient attended discharge planning group.  She advised of feeling better and requested discharge.  She denied SI/HI and rated depression at one/two and anxiety at zero.  Patient reluctantly consented to allow contact with mother but agree to as MD wanted to obtain family history of depression/other mental illness and to provide suicide prevention education.  Writer contacted mother and informed her patient admitted to the hospital with depression and that she had been dealing with depression for a while.  She was asked if she was aware of problems patient had been experiencing. Mother was very unpleasant and stated she did not really know of anything other than a friend being diagnosed with AIDS.  She was asked if anyone in the family had similar or other psychiatric diagnosis.  She advised that no one on either side of patient family had ever had depression or any type of mental illness.  Mother was asked how much she knew about depression as we like to educate family about the diagnosis.  She stated she knew about depression and that she did not have time to talk about depression and for writer to "hurry up and tell me what I need to know ."   She stated she was not in the frame of mind to receive this information.  Writer asked if mother wanted her to call back.  She stated, "no, do it now and let's get it over with."  Writer politely ended call with mother stating need to call her back within a few minutes.  Writer spoke with MD to advise it would not  be in patient's best interest to disclose information on suicidal prevention as mother seemed inpatient and unacceptable of a mental diagnosis.  MD advised it would be okay not to provide mother with additional information.  Writer also consulted with hospital attorney who advised it was appropriate not to give information to mother it was not going to be in patient's best interest.  Writer called mother back and advised her patient doing well and is expected to  discharge from the hospital tomorrow.  Patient was informed that mother was given diagnosis of depression but not given other information regarding hospital admission.  Patient was very upset with mother knowing about her being depressed.  She was advised of the right to withdraw consent to release information to mother.  Consent was withdrawn.

## 2012-10-27 NOTE — Progress Notes (Signed)
D:  Patient's self inventory sheet, patient stated she has poor sleep, good appetite, normal energy level, good attention span.   Denied depression, denied anxiety, denied hopelessness.  Denied withdrawals.  Denied SI.   Denied physical problems.  After discharge, plans to control her anger, go to her MD appointments, take her scheduled medications.  Patient stated she is upset that case manager talked to her mother.   Plans to go to her own place after discharge.  Has money problems, may need financial assistance to buy medications while she is still in school.  A:  Medications given per MD order.   Support and encouragement given throughout day.  Support and safety checks completed as ordered. R:   Following treatment plan.   Denied SI and HI.   Denied A/V hallucinations.   Denied pain.  Contracts for safety.  Patient remains safe and receptive on unit.  Patient attended group and participated this morning.  Patient stated she did not want her mother to know that she is at [facility].  Does not want her mother to know anything about her care.  Patient did not live with mom before admission and will be live with mom after discharge from [facility].  Is looking forward to discharge tomorrow.  Feels her mother will "sandbag me".

## 2012-10-27 NOTE — Progress Notes (Signed)
[facility] MD Progress Note  10/27/2012 10:19 AM Cathy Manning  MRN:  [id number] S: Patient seen this morning. States feeling better. Patient presents in her gown, loud in speech, concrete in her thinking. Not very forthcoming about any manic symptoms other than the hypersexual behaviors. Denies sleep problems. Very focused on discharge. Tolerating Zoloft well.  Diagnosis:   Axis I: Depressive Disorder NOS Axis II: No diagnosis Axis III:  Past Medical History  Diagnosis Date  . H/O suicide attempt    Axis IV: educational problems and occupational problems Axis V: 51-60 moderate symptoms  ADL's:  Impaired  Sleep: Fair  Appetite:  Fair   Mental Status Examination/Evaluation: Objective:  Appearance: Disheveled  Eye Contact::  Fair  Speech:  Clear and Coherent  Volume:  Increased  Mood:  Anxious  Affect:  Blunt  Thought Process:  Circumstantial  Orientation:  Full  Thought Content:  WDL  Suicidal Thoughts:  No  Homicidal Thoughts:  No  Memory:  Immediate;   Fair Recent;   Fair Remote;   Fair  Judgement:  Impaired  Insight:  Lacking  Psychomotor Activity:  Normal  Concentration:  Fair  Recall:  Fair  Akathisia:  No  Handed:  Right  AIMS (if indicated):     Assets:  Communication Skills Physical Health  Sleep:  Number of Hours: 5.25    Vital Signs:Blood pressure 124/84, pulse 102, temperature 98.3 F (36.8 C), temperature source Oral, resp. rate 15, height 5\' 7"  (1.702 m), weight 105.235 kg (232 lb), last menstrual period 10/03/2012. Current Medications: Current Facility-Administered Medications  Medication Dose Route Frequency Provider Last Rate Last Dose  . acetaminophen (TYLENOL) tablet 650 mg  650 mg Oral Q6H PRN Nanine Means, NP      . alum & mag hydroxide-simeth (MAALOX/MYLANTA) 200-200-20 MG/5ML suspension 30 mL  30 mL Oral Q4H PRN Nanine Means, NP      . hydrOXYzine (ATARAX/VISTARIL) tablet 25 mg  25 mg Oral TID PRN Nanine Means, NP      . hydrOXYzine  (ATARAX/VISTARIL) tablet 25 mg  25 mg Oral QHS PRN Nanine Means, NP   25 mg at 10/26/12 2213  . ibuprofen (ADVIL,MOTRIN) tablet 400 mg  400 mg Oral Q6H PRN Marise Knapper, MD      . magnesium hydroxide (MILK OF MAGNESIA) suspension 30 mL  30 mL Oral Daily PRN Nanine Means, NP      . sertraline (ZOLOFT) tablet 25 mg  25 mg Oral Daily Amabel Stmarie, MD   25 mg at 10/27/12 0815  . sulfamethoxazole-trimethoprim (BACTRIM DS) 800-160 MG per tablet 1 tablet  1 tablet Oral Q12H Sriman Tally, MD   1 tablet at 10/27/12 0815    Lab Results:  Results for orders placed during the hospital encounter of 10/25/12 (from the past 48 hour(s))  BASIC METABOLIC PANEL     Status: Abnormal   Collection Time   10/26/12  6:15 AM      Component Value Range Comment   Sodium 138  135 - 145 mEq/L    Potassium 3.6  3.5 - 5.1 mEq/L    Chloride 101  96 - 112 mEq/L    CO2 28  19 - 32 mEq/L    Glucose, Bld 84  70 - 99 mg/dL    BUN 10  6 - 23 mg/dL    Creatinine, Ser 0.86  0.50 - 1.10 mg/dL    Calcium 9.5  8.4 - 57.8 mg/dL    GFR calc non Af Amer 85 (*) >  90 mL/min    GFR calc Af Amer >90  >90 mL/min   MAGNESIUM     Status: Normal   Collection Time   10/26/12  6:15 AM      Component Value Range Comment   Magnesium 2.1  1.5 - 2.5 mg/dL   GLUCOSE, CAPILLARY     Status: Abnormal   Collection Time   10/26/12  9:18 PM      Component Value Range Comment   Glucose-Capillary 152 (*) 70 - 99 mg/dL     Physical Findings: AIMS: Facial and Oral Movements Muscles of Facial Expression: None, normal Lips and Perioral Area: None, normal Jaw: None, normal Tongue: None, normal,Extremity Movements Upper (arms, wrists, hands, fingers): None, normal Lower (legs, knees, ankles, toes): None, normal, Trunk Movements Neck, shoulders, hips: None, normal, Overall Severity Severity of abnormal movements (highest score from questions above): None, normal Incapacitation due to abnormal movements: None, normal Patient's awareness of  abnormal movements (rate only patient's report): No Awareness, Dental Status Current problems with teeth and/or dentures?: No Does patient usually wear dentures?: No  CIWA:    COWS:     Treatment Plan Summary: Daily contact with patient to assess and evaluate symptoms and progress in treatment Medication management  Plan: Obtain collateral from mother regarding presence of bipolar disorder in the family and history of patient`s symptoms. Continue to monitor patient on Zoloft. Will plan for discharge in the next 1-2 days.  Kea Callan 10/27/2012, 10:19 AM

## 2012-10-28 MED ORDER — SERTRALINE HCL 25 MG PO TABS: 25.0000 mg | ORAL_TABLET | Freq: Every day | ORAL | Status: DC

## 2012-10-28 NOTE — Progress Notes (Signed)
Patient ID: Cathy Manning, female   DOB: 07-23-1986, 26 y.o.   MRN: [id number] D: Pt. Irritable reports she is angry with CM who called her mom. "She tricked me, I didn't want my mom to know I'm here, she's to judgmental. "My mom's a nurse, but she's the kind of people who think people like me should just get over it" A: Staff will monitor q26min for safety.  Staff encouraged karaoke. Writer encouraged pt. To channel anger in a positive direction. Encourage pt. To verbalize concerns tomorrow with physician. R: Pt. Is safe on the unit. Pt. Attended karaoke. Pt. Reports "I'm not mad anymore" "I just didn't like that she called my mother."

## 2012-10-28 NOTE — Progress Notes (Signed)
D) Pt being discharged to school accompanied by her friend. Affect and mood are appropriate. Denies SI and HI.  A) Given support, reassurance and praise. Having a problem and upset that she was not discharged earlier. Happier when she walked out the door. All belongings returned to Pt. All medication explained. Pt is aware of her follow up appointment. R) Denies SI and HI. Given support and reassurance.

## 2012-10-28 NOTE — Progress Notes (Signed)
Psychoeducational Group Note  Date:  10/28/2012 Time:  1100  Group Topic/Focus:  Early Warning Signs:   The focus of this group is to help patients identify signs or symptoms they exhibit before slipping into an unhealthy state or crisis.  Participation Level:  Did Not Attend  Participation Quality:    Affect:    Cognitive:    Insight:   Engagement in Group:    Additional Comments:    Noah Charon 10/28/2012, 2:09 PM

## 2012-10-28 NOTE — Progress Notes (Signed)
[facility] Group Notes:  (Counselor/Nursing/MHT/Case Management/Adjunct)   Relapse Prevention 1:15-2:30   Discharge Planning Group 8:30-9:30   10/28/2012 3:30 PM  Type of Therapy:  Group Therapy  Participation Level:  Minimal  Participation Quality:  Appropriate, Drowsy and Inattentive  Affect:  Blunted, Flat and Irritable  Cognitive:  Oriented  Insight:  Limited  Engagement in Group:  Limited  Engagement in Therapy:  Limited  Modes of Intervention:  Education, Problem-solving and Support  Summary of Progress/Problems:  Patient was inattentive during group. She was upset other patients were discharging before her.  D/C Planning Group: Patient is not endorsing SI/HI.  She rates all symptoms at zero.  She was upset mother contact for collateral information.  SW explained that due to mother's negative attitude no information was released.   Manning, Cathy L 10/28/2012, 3:30 PM

## 2012-10-28 NOTE — [facility] Suicide Risk Assessment (Signed)
Suicide Risk Assessment  Discharge Assessment     Demographic Factors:  Female, african [place], single  Mental Status Per Nursing Assessment::   On Admission:     Current Mental Status by Physician: Patient alert and oriented to 4. Mood improved. Denies AH/VH/SI/HI.  Loss Factors: Loss of significant relationship  Historical Factors: Impulsivity  Risk Reduction Factors:   Positive social support  Continued Clinical Symptoms:  Depression:   Recent sense of peace/wellbeing  Cognitive Features That Contribute To Risk:  Cognitively intact  Suicide Risk:  Minimal: No identifiable suicidal ideation.  Patients presenting with no risk factors but with morbid ruminations; may be classified as minimal risk based on the severity of the depressive symptoms  Discharge Diagnoses:   AXIS I:  Depressive Disorder NOS AXIS II:  No diagnosis AXIS III:   Past Medical History  Diagnosis Date  . H/O suicide attempt    AXIS IV:  occupational problems AXIS V:  61-70 mild symptoms  Plan Of Care/Follow-up recommendations:  Activity:  normal Diet:  normal Follow  Up with her outpatient appointments.  Is patient on multiple antipsychotic therapies at discharge:  No   Has Patient had three or more failed trials of antipsychotic monotherapy by history:  No  Recommended Plan for Multiple Antipsychotic Therapies: NA  Cathy Manning 10/28/2012, 9:48 AM

## 2012-10-28 NOTE — Tx Team (Signed)
Interdisciplinary Treatment Plan Update (Adult)  Date:  10/28/2012  Time Reviewed:  9:50 AM   Progress in Treatment: Attending groups:   Yes   Participating in groups:  Yes Taking medication as prescribed:  Yes Tolerating medication:  Yes Family/Significant othe contact made: Contact made with family Patient understands diagnosis:  Yes Discussing patient identified problems/goals with staff: Yes Medical problems stabilized or resolved: Yes Denies suicidal/homicidal ideation:Yes Issues/concerns per patient self-inventory:  Other:  New problem(s) identified:  Reason for Continuation of Hospitalization:  Interventions implemented related to continuation of hospitalization:   Additional comments:  Estimated length of stay:  Discharge home today  Discharge Plan:  Home with outpatient follow up with [facility]  New goal(s):  Review of initial/current patient goals per problem list:    1.  Goal(s): Eliminate SI/other thoughts of self harm   Met:  Yes  Target date: d/c  As evidenced by: Patient no longer endorsing SI/HI or other thoughts of self harm.  2.  Goal (s):Reduce depression/anxiety   Met:  Yes  Target date: d/c  As evidenced by: Patient currently rating symptoms at four or below  3.  Goal(s):.stabilize on meds   Met:  Yes  Target date: d/c  As evidenced by: Patient reports being stabilized on medications - less symptomatic    4.  Goal(s): Refer for outpatient follow up   Met:  Yes  Target date: d/c  As evidenced by: Follow up appointment scheduled    Attendees: Patient:  Cathy Manning 10/28/2012 9:50 AM  Physican:  Patrick North, MD 10/28/2012 9:50 AM  Nursing:  Harold Barban, RN. 10/28/2012 9:50 AM   Nursing:    10/28/2012 9:50 AM   Clinical Social Worker:  Juline Patch, LCSW 10/28/2012 9:50 AM   Other: Teddy Spike, MSW Intern 10/28/2012 9:50 AM

## 2012-10-28 NOTE — Progress Notes (Signed)
[facility] Case Management Discharge Plan:  Will you be returning to the same living situation after discharge: Yes,  Patient to return to her apartment At discharge, do you have transportation home?:Yes,  Patient to arrange transportation home Do you have the ability to pay for your medications:Yes,  Patient has insurance and ability to make co-pay  Interagency Information:     Release of information consent forms completed and in the chart;  Patient's signature needed at discharge.  Patient to Follow up at:  Follow-up Information    Follow up with [facility]. On 11/02/2012. (Patient has an appointment with Charlyne Quale on Wednesday, November 02, 2012 at [facility].)    Contact information:   [address]   [place], [place] [address] [phone]      Follow up with [facility] On 11/08/2012. (Patient has an appointment with Dr. Burnadette Peter on Tuesday, November 08, 2012 at [place]. )    Contact information:   [address]   [place], [place] [address] [phone]      Follow up with [facility]. On 11/10/2012. (Patient has an appointment with Dr. Bobby Rumpf on Thursday, November 10, 2012 at 1pm.)    Contact information:   [address]   [place], [place] [address] [phone]         Patient denies SI/HI:   Yes,  Patient is no longe endorsing SI/HI or other thoughts of self harm    Safety Planning and Suicide Prevention discussed:  Yes,  Reviewed during aftercare grop  Barrier to discharge identified:Yes,  Limited support system  Summary and Recommendations:Patient encouraged to be compliant with medications and to follow up with all outpatient recommendations.  '   Wynn Banker 10/28/2012, 11:41 AM

## 2012-10-31 NOTE — Progress Notes (Signed)
Patient Discharge Instructions:  After Visit Summary (AVS):   Faxed to:  10/31/2012 Psychiatric Admission Assessment Note:   Faxed to:  10/31/2012 Suicide Risk Assessment - Discharge Assessment:   Faxed to:  10/31/2012 Faxed/Sent to the Next Level Care provider:  10/31/2012  Faxed to [facility] @ [phone]  Wandra Scot, 10/31/2012, 4:22 PM

## 2012-11-01 NOTE — Discharge Summary (Signed)
Physician Discharge Summary Note  Patient:  Cathy Manning is an 26 y.o., female MRN:  [id number] DOB:  1986/11/24 Patient phone:  [phone] (home)  Patient address:   [address] [place] [address]   Date of Admission:  10/25/2012 Date of Discharge: 10/28/2012  Discharge Diagnoses: Principal Problem:  *Depression  Axis Diagnosis: AXIS I: Depressive Disorder NOS  AXIS II: No diagnosis  AXIS III:  Past Medical History   Diagnosis  Date   .  H/O suicide attempt     AXIS IV: occupational problems  AXIS V: 61-70 mild symptoms    Level of Care:  Inpatient Hospitalization.  Reason for admission: Patient is a 26 yo AAF admitted with suicidal ideations, she was brought by her counsellor from school. She states she was planning on committing suicide this past Monday by swallowing a toxin made from bleach. She had an elaborate plan to get dressed in her best and then die. She had a bad relationship recently, broke up with her boyfriend. She reports using sexual relations as a way to cope. School has been a stressor for her, not doing well academically.  Mood Symptoms: Depression,  Sadness,  SI,  Sleep,   Hospital Course:   The patient attended treatment team meeting this am and met with treatment team members. The patient's symptoms, treatment plan and response to treatment was discussed. The patient endorsed that their symptoms have improved. The patient also stated that they felt stable for discharge.  They reported that from this hospital stay they had learned many coping skills.  In other to maintain their psychiatric stability, they will continue psychiatric care on an outpatient basis. They will follow-up as outlined below.  In addition they were instructed  to take all your medications as prescribed by their mental healthcare provider and to report any adverse effects and or reactions from your medicines to their outpatient provider promptly.  The patient is also  instructed and cautioned to not engage in alcohol and or illegal drug use while on prescription medicines.  In the event of worsening symptoms the patient is instructed to call the crisis hotline, 911 and or go to the nearest ED for appropriate evaluation and treatment of symptoms.   Also while a patient in this hospital, the patient received medication management for her psychiatric symptoms. They were ordered and received as outlined below:    Medication List     As of 11/01/2012 12:27 PM    STOP taking these medications         ibuprofen 200 MG tablet   Commonly known as: ADVIL,MOTRIN      TAKE these medications      Indication    sertraline 25 MG tablet   Commonly known as: ZOLOFT   Take 1 tablet (25 mg total) by mouth daily.    Indication: Major Depressive Disorder       They were also enrolled in group counseling sessions and activities in which they participated actively.       Follow-up Information    Follow up with [facility]. On 11/02/2012. (Patient has an appointment with Cathy Manning on Wednesday, November 02, 2012 at [facility]    Contact information:   [address]   [place], [place] [address] [phone]      Follow up with [facility]. On 11/08/2012. (Patient has an appointment with Dr. Burnadette Peter on Tuesday, November 08, 2012 at 3pm. )    Contact information:   [address]   [place], [place] [address] [phone]      Follow up with [facility]. On 11/10/2012. (Patient has an appointment with Dr. Bobby Manning on Thursday, November 10, 2012 at 1pm.)    Contact information:   [address]   [place], [place] [address] [phone]         Upon discharge, patient adamantly denies suicidal, homicidal ideations, auditory, visual hallucinations and or delusional thinking. They left [facility] with all personal belongings via personal transportation in no apparent distress.  Consults:  Please see electronic medical record for details.  Significant Diagnostic  Studies:  Please see electronic medical record for details.  Discharge Vitals:   Blood pressure 137/93, pulse 85, temperature 97.7 F (36.5 C), temperature source Oral, resp. rate 12, height 5\' 7"  (1.702 m), weight 105.235 kg (232 lb), last menstrual period 10/03/2012..  Mental Status Exam: See Mental Status Examination and Suicide Risk Assessment completed by Attending Physician prior to discharge.  Discharge destination:  Home  Is patient on multiple antipsychotic therapies at discharge:  No  Has Patient had three or more failed trials of antipsychotic monotherapy by history: N/A Recommended Plan for Multiple Antipsychotic Therapies: N/A Discharge Orders    Future Orders Please Complete By Expires   Diet - low sodium heart healthy      Diet - low sodium heart healthy      Increase activity slowly      Increase activity slowly          Medication List     As of 11/01/2012 12:27 PM    STOP taking these medications         ibuprofen 200 MG tablet   Commonly known as: ADVIL,MOTRIN      TAKE these medications      Indication    sertraline 25 MG tablet   Commonly known as: ZOLOFT   Take 1 tablet (25 mg total) by mouth daily.    Indication: Major Depressive Disorder           Follow-up Information    Follow up with [facility]. On 11/02/2012. (Patient has an appointment with Cathy Manning on Wednesday, November 02, 2012 at [facility].)    Contact information:   [address]   [place], [place] [address] [phone]      Follow up with [facility]. On 11/08/2012. (Patient has an appointment with Dr. Burnadette Peter on Tuesday, November 08, 2012 at [place]. )    Contact information:   [address]   [place], [place] [address] [phone]      Follow up with [facility]. On 11/10/2012. (Patient has an appointment with Dr. Bobby Manning on Thursday, November 10, 2012 at 1pm.)    Contact information:   [address]   [place], [place] [address] [phone]        Follow-up  recommendations:   Activities: Resume typical activities Diet: Resume typical diet Other: Follow up with outpatient provider and report any side effects to out patient prescriber.  Comments:  Take all your medications as prescribed by your mental healthcare provider. Report any adverse effects and or reactions from your medicines to your outpatient provider promptly. Patient is instructed and cautioned to not engage in alcohol and or illegal drug use while on prescription medicines. In the event of worsening symptoms, patient is instructed to call the crisis hotline, 911 and or go to the nearest ED for appropriate evaluation and treatment of symptoms. Follow-up with your primary care provider for your other  medical issues, concerns and or health care needs.  SignedPatrick North 11/01/2012 12:27 PM

## 2012-11-25 ENCOUNTER — Emergency Department (HOSPITAL_COMMUNITY)
Admission: EM | Admit: 2012-11-25 | Discharge: 2012-11-25 | Disposition: A | Discharge status: Home / Self Care | Payer: PRIVATE HEALTH INSURANCE | Attending: Emergency Medicine | Admitting: Emergency Medicine

## 2012-11-25 ENCOUNTER — Encounter (HOSPITAL_COMMUNITY): Payer: Self-pay | Admitting: Emergency Medicine

## 2012-11-25 DIAGNOSIS — N898 Other specified noninflammatory disorders of vagina: Secondary | ICD-10-CM | POA: Insufficient documentation

## 2012-11-25 LAB — WET PREP, GENITAL: Trich, Wet Prep: NONE SEEN

## 2012-11-25 NOTE — ED Provider Notes (Signed)
History     CSN: [id number]  Arrival date & time 11/25/12  1121   First MD Initiated Contact with Patient 11/25/12 1140      Chief Complaint  Patient presents with  . Vaginal Itching    3 week hx of vaginal "bumps" , occasional itching    (Consider location/radiation/quality/duration/timing/severity/associated sxs/prior treatment) The history is provided by the patient.   the patient reports noting 3 small bumps near the inferior aspect of her vagina that began about 3 days ago.  She states they itch.  No fevers or chills.  No vaginal discharge.  No abdominal pain.  No nausea or vomiting.  No history of STD.  She does report vaginal intercourse several days ago at which point the vaginal bumps were noted after this.  No vaginal bleeding.  Past Medical History  Diagnosis Date  . H/O suicide attempt     Past Surgical History  Procedure Date  . Mouth surgery     Family History  Problem Relation Age of Onset  . Diabetes Mother   . Hypertension Mother   . Diabetes Father   . Hypertension Father     History  Substance Use Topics  . Smoking status: Never Smoker   . Smokeless tobacco: Not on file  . Alcohol Use: Yes     Comment: occassionally    OB History    Grav Para Term Preterm Abortions TAB SAB Ect Mult Living                  Review of Systems  All other systems reviewed and are negative.    Allergies  Review of patient's allergies indicates no known allergies.  Home Medications  No current outpatient prescriptions on file.  BP 159/103  Pulse 77  Temp 98.6 F (37 C) (Oral)  Resp 18  SpO2 100%  LMP 11/04/2012  Physical Exam  Nursing note and vitals reviewed. Constitutional: She is oriented to person, place, and time. She appears well-developed and well-nourished. No distress.  HENT:  Head: Normocephalic and atraumatic.  Eyes: EOM are normal.  Neck: Normal range of motion.  Cardiovascular: Normal rate, regular rhythm and normal heart sounds.     Pulmonary/Chest: Effort normal and breath sounds normal.  Abdominal: Soft. She exhibits no distension. There is no tenderness.  Genitourinary:       Normal external genitalia except for a small area of 3 white bumps at 6:00 around her vaginal introitus.  These did not have an ulcerated appearance.  There is no erythema at the base.  This does not appear to be herpes.  They are nontender and nonfriable.  There are punctate  Musculoskeletal: Normal range of motion.  Neurological: She is alert and oriented to person, place, and time.  Skin: Skin is warm and dry.  Psychiatric: She has a normal mood and affect. Judgment normal.    ED Course  Procedures (including critical care time)  Labs Reviewed - No data to display No results found.   1. Vaginal lesion       MDM  This may represent HPV.  I will refer the patient to Dr. Su Monks public health STD clinic.  No indication for treatment today.  This does not appear to be herpes.        Lyanne Co, MD 11/25/12 [phone]
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
## 2012-11-28 LAB — GC/CHLAMYDIA PROBE AMP
CT Probe RNA: NEGATIVE
GC Probe RNA: POSITIVE — AB

## 2012-12-04 ENCOUNTER — Telehealth (HOSPITAL_COMMUNITY): Payer: Self-pay | Admitting: Emergency Medicine

## 2012-12-04 NOTE — ED Notes (Signed)
+  Gonorrhea. Chart sent to EDP office for review. DHHS attached. °

## 2012-12-04 NOTE — ED Notes (Signed)
Chart returned from EDP office. Prescribed Cefixime 400 mg PO x 1 and Doxycycline 100 mg PO BID x 7 days. Prescribed by Teressa Lower NP.

## 2012-12-05 ENCOUNTER — Telehealth (HOSPITAL_COMMUNITY): Payer: Self-pay | Admitting: Emergency Medicine

## 2013-04-03 ENCOUNTER — Emergency Department (HOSPITAL_COMMUNITY)
Admission: EM | Admit: 2013-04-03 | Discharge: 2013-04-04 | Disposition: A | Discharge status: Home / Self Care | Payer: BC Managed Care – PPO | Attending: Emergency Medicine | Admitting: Emergency Medicine

## 2013-04-03 ENCOUNTER — Emergency Department (HOSPITAL_COMMUNITY): Payer: BC Managed Care – PPO

## 2013-04-03 ENCOUNTER — Encounter (HOSPITAL_COMMUNITY): Payer: Self-pay | Admitting: *Deleted

## 2013-04-03 DIAGNOSIS — N939 Abnormal uterine and vaginal bleeding, unspecified: Secondary | ICD-10-CM

## 2013-04-03 DIAGNOSIS — S62609A Fracture of unspecified phalanx of unspecified finger, initial encounter for closed fracture: Secondary | ICD-10-CM | POA: Insufficient documentation

## 2013-04-03 DIAGNOSIS — N898 Other specified noninflammatory disorders of vagina: Secondary | ICD-10-CM | POA: Insufficient documentation

## 2013-04-03 DIAGNOSIS — Z79899 Other long term (current) drug therapy: Secondary | ICD-10-CM | POA: Insufficient documentation

## 2013-04-03 DIAGNOSIS — Z8659 Personal history of other mental and behavioral disorders: Secondary | ICD-10-CM | POA: Insufficient documentation

## 2013-04-03 LAB — WET PREP, GENITAL: Clue Cells Wet Prep HPF POC: NONE SEEN

## 2013-04-03 NOTE — ED Notes (Signed)
Pt states she has been having vaginal bleeding for the past couple of days. Pt states the her last norman menstrual cycle was in February and that she has been bleeding off and on and the last time lasted 10 days, which is abnormal for her. Pt also has complaints of left middle finger hurting. Left finger does have some swelling and looks deformed. Pt is able to bend finger but states it is really painful.
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
## 2013-04-03 NOTE — ED Notes (Signed)
Pelvic supplies at bedside. 
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
## 2013-04-04 LAB — GC/CHLAMYDIA PROBE AMP: GC Probe RNA: NEGATIVE

## 2013-04-04 NOTE — ED Provider Notes (Signed)
Medical screening examination/treatment/procedure(s) were performed by non-physician practitioner and as supervising physician I was immediately available for consultation/collaboration.  Raeford Razor, MD 04/04/13 1440

## 2013-09-08 ENCOUNTER — Emergency Department (HOSPITAL_COMMUNITY): Payer: BC Managed Care – PPO

## 2013-09-08 ENCOUNTER — Emergency Department (HOSPITAL_COMMUNITY)
Admission: EM | Admit: 2013-09-08 | Discharge: 2013-09-08 | Disposition: A | Discharge status: Home / Self Care | Payer: BC Managed Care – PPO | Attending: Emergency Medicine | Admitting: Emergency Medicine

## 2013-09-08 ENCOUNTER — Encounter (HOSPITAL_COMMUNITY): Payer: Self-pay | Admitting: Emergency Medicine

## 2013-09-08 DIAGNOSIS — M545 Low back pain, unspecified: Secondary | ICD-10-CM | POA: Insufficient documentation

## 2013-09-08 DIAGNOSIS — Z8659 Personal history of other mental and behavioral disorders: Secondary | ICD-10-CM | POA: Insufficient documentation

## 2013-09-08 DIAGNOSIS — N939 Abnormal uterine and vaginal bleeding, unspecified: Secondary | ICD-10-CM

## 2013-09-08 DIAGNOSIS — N898 Other specified noninflammatory disorders of vagina: Secondary | ICD-10-CM | POA: Insufficient documentation

## 2013-09-08 DIAGNOSIS — N6459 Other signs and symptoms in breast: Secondary | ICD-10-CM | POA: Insufficient documentation

## 2013-09-08 DIAGNOSIS — R112 Nausea with vomiting, unspecified: Secondary | ICD-10-CM | POA: Insufficient documentation

## 2013-09-08 DIAGNOSIS — Z3202 Encounter for pregnancy test, result negative: Secondary | ICD-10-CM | POA: Insufficient documentation

## 2013-09-08 LAB — BASIC METABOLIC PANEL
BUN: 9 mg/dL (ref 6–23)
CO2: 28 mEq/L (ref 19–32)
Chloride: 98 mEq/L (ref 96–112)
GFR calc Af Amer: >90 mL/min (ref >90)
Potassium: 4 mEq/L (ref 3.5–5.1)

## 2013-09-08 LAB — WET PREP, GENITAL: Yeast Wet Prep HPF POC: NONE SEEN

## 2013-09-08 LAB — CBC
HCT: 38.2 % (ref 36.0–46.0)
MCV: 77.2 fL — ABNORMAL LOW (ref 78.0–100.0)
RBC: 4.95 MIL/uL (ref 3.87–5.11)
RDW: 14.2 % (ref 11.5–15.5)
WBC: 10.2 10*3/uL (ref 4.0–10.5)

## 2013-09-08 LAB — ABO/RH: ABO/RH(D): B POS

## 2013-09-08 MED ORDER — ONDANSETRON HCL 4 MG/2ML IJ SOLN
4.0000 mg | Freq: Once | INTRAMUSCULAR | Status: AC
  Administered 2013-09-08: 4 mg via INTRAVENOUS
  Filled 2013-09-08: qty 2

## 2013-09-08 MED ORDER — SODIUM CHLORIDE 0.9 % IV BOLUS (SEPSIS)
1000.0000 mL | Freq: Once | INTRAVENOUS | Status: AC
  Administered 2013-09-08: 1000 mL via INTRAVENOUS

## 2013-09-08 NOTE — ED Provider Notes (Signed)
CSN: [id number]     Arrival date & time 09/08/13  1420 History   First MD Initiated Contact with Patient 09/08/13 1512     Chief Complaint  Patient presents with  . Vaginal Bleeding  . Nausea  . breast tenderness   . Back Pain   (Consider location/radiation/quality/duration/timing/severity/associated sxs/prior Treatment) The history is provided by the patient and medical records. No language interpreter was used.    Cathy Manning is a 27 y.o. female  with a hx of PCOS presents to the Emergency Department complaining of gradual, persistent, progressively worsening vaginal bleeding beginning 6 days ago.  Pt reports she is using 2 heavy pads per day. Associated symptoms include nausea, vomiting, breast tenderness and low back pain.  Pt denies any abd pain or cramping.  Nothing seems to make the bleeding better or worse.  Pt states she has been craving pineapple but states she normally hates the taste and does not regularly eat it.  Pt denies chills, chest pain, SOB, abd pain, diarrhea, weakness, dizziness, syncope, dysuria, hematuria.  LMP likely Aug 6th, but pt has irregular periods.  Pt is not on a birth control and sexually active with 1 female partner.     Past Medical History  Diagnosis Date  . H/O suicide attempt    Past Surgical History  Procedure Laterality Date  . Mouth surgery     Family History  Problem Relation Age of Onset  . Diabetes Mother   . Hypertension Mother   . Diabetes Father   . Hypertension Father    History  Substance Use Topics  . Smoking status: Never Smoker   . Smokeless tobacco: Not on file  . Alcohol Use: No   OB History   Grav Para Term Preterm Abortions TAB SAB Ect Mult Living   1              Review of Systems  Constitutional: Negative for fever, diaphoresis, appetite change, fatigue and unexpected weight change.  HENT: Negative for mouth sores and neck stiffness.   Eyes: Negative for visual disturbance.  Respiratory: Negative for cough,  chest tightness, shortness of breath and wheezing.   Cardiovascular: Negative for chest pain.  Gastrointestinal: Positive for nausea. Negative for vomiting, abdominal pain, diarrhea and constipation.  Endocrine: Negative for polydipsia, polyphagia and polyuria.  Genitourinary: Positive for vaginal bleeding. Negative for dysuria, urgency, frequency, hematuria and decreased urine volume.  Musculoskeletal: Positive for back pain (low).  Skin: Negative for rash.  Allergic/Immunologic: Negative for immunocompromised state.  Neurological: Negative for syncope, light-headedness and headaches.  Hematological: Does not bruise/bleed easily.  Psychiatric/Behavioral: Negative for sleep disturbance. The patient is not nervous/anxious.     Allergies  Review of patient's allergies indicates no known allergies.  Home Medications   Current Outpatient Rx  Name  Route  Sig  Dispense  Refill  . ibuprofen (ADVIL,MOTRIN) 200 MG tablet   Oral   Take 200 mg by mouth every 6 (six) hours as needed for pain.          BP 149/89  Pulse 84  Temp(Src) 99 F (37.2 C) (Oral)  Resp 18  SpO2 99%  LMP 03/14/2013 Physical Exam  Nursing note and vitals reviewed. Constitutional: She is oriented to person, place, and time. She appears well-developed and well-nourished. No distress.  Awake, alert, nontoxic appearance  HENT:  Head: Normocephalic and atraumatic.  Mouth/Throat: Oropharynx is clear and moist. No oropharyngeal exudate.  Eyes: Conjunctivae are normal. Pupils are equal, round, and reactive  to light. No scleral icterus.  Neck: Normal range of motion. Neck supple.  Full ROM without pain  Cardiovascular: Normal rate, regular rhythm, normal heart sounds and intact distal pulses.   No murmur heard. Pulmonary/Chest: Effort normal and breath sounds normal. No respiratory distress. She has no wheezes.  Abdominal: Soft. Bowel sounds are normal. She exhibits no distension and no mass. There is no  hepatosplenomegaly. There is no tenderness. There is no rigidity, no rebound, no guarding and no CVA tenderness. Hernia confirmed negative in the right inguinal area and confirmed negative in the left inguinal area.  obese  Genitourinary: Uterus normal. Pelvic exam was performed with patient supine. There is no rash, tenderness, lesion or injury on the right labia. There is no rash, tenderness, lesion or injury on the left labia. Uterus is not deviated, not enlarged, not fixed and not tender. Cervix exhibits no motion tenderness, no discharge and no friability. Right adnexum displays no mass, no tenderness and no fullness. Left adnexum displays no mass, no tenderness and no fullness. There is bleeding around the vagina. No erythema or tenderness around the vagina. No foreign body around the vagina. No signs of injury around the vagina. No vaginal discharge found.  Musculoskeletal: Normal range of motion. She exhibits no edema and no tenderness.  Full range of motion of the T-spine and L-spine No tenderness to palpation of the spinous processes of the T-spine or L-spine No tenderness to palpation of the paraspinous muscles of the L-spine  Lymphadenopathy:    She has no cervical adenopathy.       Right: No inguinal adenopathy present.       Left: No inguinal adenopathy present.  Neurological: She is alert and oriented to person, place, and time. She has normal reflexes. She exhibits normal muscle tone. Coordination normal.  Speech is clear and goal oriented, follows commands Normal strength in upper and lower extremities bilaterally including dorsiflexion and plantar flexion, strong and equal grip strength Sensation normal to light and sharp touch Moves extremities without ataxia, coordination intact Normal gait Normal balance   Skin: Skin is warm and dry. No rash noted. She is not diaphoretic. No erythema.  Psychiatric: She has a normal mood and affect. Her behavior is normal.    ED Course   Procedures (including critical care time) Labs Review Labs Reviewed  WET PREP, GENITAL - Abnormal; Notable for the following:    Clue Cells Wet Prep HPF POC RARE (*)    WBC, Wet Prep HPF POC RARE (*)    All other components within normal limits  CBC - Abnormal; Notable for the following:    MCV 77.2 (*)    MCH 25.9 (*)    All other components within normal limits  BASIC METABOLIC PANEL - Abnormal; Notable for the following:    Sodium 134 (*)    All other components within normal limits  GC/CHLAMYDIA PROBE AMP  HCG, QUANTITATIVE, PREGNANCY  POCT PREGNANCY, URINE  ABO/RH   Imaging Review US Ob Comp Less 14 Wks  09/08/2013   CLINICAL DATA:  Vaginal bleeding.  EXAM: OBSTETRIC <14 WK [place] AND TRANSVAGINAL OB US  TECHNIQUE: Both transabdominal and transvaginal ultrasound examinations were performed for complete evaluation of the gestation as well as the maternal uterus, adnexal regions, and pelvic cul-de-sac. Transvaginal technique was performed to assess early pregnancy.  COMPARISON:  None.  FINDINGS: Intrauterine gestational sac: Absent  Yolk sac:  Absent  Embryo:  Absent  Cardiac Activity: N/A  Heart Rate:  N/A  Maternal uterus/adnexae: 1 4 cm fibroid in the right side of the uterus. Endometrium 9 mm in thickness. Uterus is retroverted.  Ovaries are symmetric in size and echotexture. No adnexal masses. No free fluid.  IMPRESSION: No intrauterine pregnancy. With a pending quantitative HCG, differential considerations would include intrauterine pregnancy too early to visualize, spontaneous abortion, or occult ectopic pregnancy. Recommend correlation with quantitative beta HCGs and followup.   Electronically Signed   By: Charlett Nose M.D.   On: 09/08/2013 17:08   US Ob Transvaginal  09/08/2013   CLINICAL DATA:  Vaginal bleeding.  EXAM: OBSTETRIC <14 WK [place] AND TRANSVAGINAL OB US  TECHNIQUE: Both transabdominal and transvaginal ultrasound examinations were performed for complete evaluation of the  gestation as well as the maternal uterus, adnexal regions, and pelvic cul-de-sac. Transvaginal technique was performed to assess early pregnancy.  COMPARISON:  None.  FINDINGS: Intrauterine gestational sac: Absent  Yolk sac:  Absent  Embryo:  Absent  Cardiac Activity: N/A  Heart Rate:  N/A  Maternal uterus/adnexae: 1 4 cm fibroid in the right side of the uterus. Endometrium 9 mm in thickness. Uterus is retroverted.  Ovaries are symmetric in size and echotexture. No adnexal masses. No free fluid.  IMPRESSION: No intrauterine pregnancy. With a pending quantitative HCG, differential considerations would include intrauterine pregnancy too early to visualize, spontaneous abortion, or occult ectopic pregnancy. Recommend correlation with quantitative beta HCGs and followup.   Electronically Signed   By: Charlett Nose M.D.   On: 09/08/2013 17:08    MDM   1. Vaginal bleeding      Merlene Morse presents with vaginal bleeding and reports of 1 positive home pregnancy test.  Urine pregnancy test negative today.  No neurologic deficit on exam of low back pain.  Alert, oriented, nontoxic, nonseptic appearing. In labs and ultrasound for further evaluation.  CBC without leukocytosis and BMP unremarkable. HCG Quant less than 1 and pelvic ultrasound without evidence of adnexal mass.  Wet prep without evidence of BV or vaginitis/cervicitis.  Pt is without abdominal pain on abdominal exam or on pelvic exam.  Patient remains alert, oriented, nontoxic, nonseptic appearing. Initial set of vitals with patient mildly hypertensive and with low-grade fever to 100.1. On reevaluation the patient's blood pressure has decreased spontaneously and temperature is 99.0  False positive home pregnancy test with dysfunctional uterine bleeding versus spontaneous abortion; less likely early ectopic pregnancy.  Recommend followup with GYN and repeat quantitative one week for further evaluation.    It has been determined that no acute  conditions requiring further emergency intervention are present at this time. The patient/guardian have been advised of the diagnosis and plan. We have discussed signs and symptoms that warrant return to the ED, such as changes or worsening in symptoms, high fevers, severe abdominal pain or an increase in her vaginal bleeding.  Vital signs are stable at discharge.   BP 149/89  Pulse 84  Temp(Src) 99 F (37.2 C) (Oral)  Resp 18  SpO2 99%  LMP 03/14/2013  Patient/guardian has voiced understanding and agreed to follow-up with the PCP or specialist.       Dierdre Forth, PA-C 09/08/13 1742

## 2013-09-08 NOTE — ED Notes (Signed)
Patient transported to US 

## 2013-09-08 NOTE — ED Notes (Signed)
Pt states that she took a home pregnancy test couple weeks ago and was positive.  Pt states that on Saturday she started having vaginal bleeding the at times is bright red blood and darker red blood.  Pt states that she goes through a menstrual pad every 5-6 hours.  Pt also c/o nausea, breast tenderness, and back pain.  Pt wants to make sure that she isnt having a miscarriage.

## 2013-09-08 NOTE — ED Notes (Signed)
PA at bedside.

## 2013-09-09 LAB — GC/CHLAMYDIA PROBE AMP: CT Probe RNA: NEGATIVE

## 2013-09-09 NOTE — ED Provider Notes (Signed)
Medical screening examination/treatment/procedure(s) were performed by non-physician practitioner and as supervising physician I was immediately available for consultation/collaboration.   Audree Camel, MD 09/09/13 0000

## 2013-09-22 ENCOUNTER — Emergency Department (HOSPITAL_COMMUNITY): Payer: BC Managed Care – PPO

## 2013-09-22 ENCOUNTER — Encounter (HOSPITAL_COMMUNITY): Payer: Self-pay | Admitting: Emergency Medicine

## 2013-09-22 ENCOUNTER — Emergency Department (HOSPITAL_COMMUNITY)
Admission: EM | Admit: 2013-09-22 | Discharge: 2013-09-22 | Disposition: A | Discharge status: Home / Self Care | Payer: BC Managed Care – PPO | Attending: Emergency Medicine | Admitting: Emergency Medicine

## 2013-09-22 DIAGNOSIS — Y929 Unspecified place or not applicable: Secondary | ICD-10-CM | POA: Insufficient documentation

## 2013-09-22 DIAGNOSIS — W010XXA Fall on same level from slipping, tripping and stumbling without subsequent striking against object, initial encounter: Secondary | ICD-10-CM | POA: Insufficient documentation

## 2013-09-22 DIAGNOSIS — S62309A Unspecified fracture of unspecified metacarpal bone, initial encounter for closed fracture: Secondary | ICD-10-CM | POA: Insufficient documentation

## 2013-09-22 DIAGNOSIS — S62316A Displaced fracture of base of fifth metacarpal bone, right hand, initial encounter for closed fracture: Secondary | ICD-10-CM

## 2013-09-22 DIAGNOSIS — Y939 Activity, unspecified: Secondary | ICD-10-CM | POA: Insufficient documentation

## 2013-09-22 MED ORDER — IBUPROFEN 800 MG PO TABS: 800.0000 mg | ORAL_TABLET | Freq: Three times a day (TID) | ORAL | Status: DC

## 2013-09-22 MED ORDER — HYDROCODONE-ACETAMINOPHEN 5-325 MG PO TABS
1.0000 | ORAL_TABLET | Freq: Once | ORAL | Status: AC
  Administered 2013-09-22: 1 via ORAL
  Filled 2013-09-22: qty 1

## 2013-09-22 MED ORDER — HYDROCODONE-ACETAMINOPHEN 5-325 MG PO TABS: 1.0000 | ORAL_TABLET | Freq: Four times a day (QID) | ORAL | Status: DC | PRN

## 2013-09-22 NOTE — ED Notes (Signed)
Pt alert and oriented x4. Respirations even and unlabored, bilateral symmetrical rise and fall of chest. Skin warm and dry. In no acute distress. Denies needs.   

## 2013-09-22 NOTE — Progress Notes (Signed)
[facility] provided pt with a list of primary care resources, highlighting [facility].  °

## 2013-09-22 NOTE — ED Notes (Signed)
Per pt, tripped and fell 3 days ago-landed on right hand-swollen

## 2013-09-22 NOTE — ED Provider Notes (Signed)
CSN: [id number]     Arrival date & time 09/22/13  0915 History   First MD Initiated Contact with Patient 09/22/13 1006     Chief Complaint  Patient presents with  . right hand pain    (Consider location/radiation/quality/duration/timing/severity/associated sxs/prior Treatment) HPI Comments: Patient is a 27 year old female sent into the emergency department for right wrist pain that occurred 3 days ago after patient tripped and fell onto a closed fist. Patient states her pain is located in the wrist with radiation into right hand. She describes it as intermittent shortness is worsened with flexion and extension of hand. She does endorse some numbness at the wrist without radiation and hand. Patient states resting her hand alleviates the pain minimally. Patient denies any history of wrist or hand injuries. Patient is right-handed.   Past Medical History  Diagnosis Date  . H/O suicide attempt    Past Surgical History  Procedure Laterality Date  . Mouth surgery     Family History  Problem Relation Age of Onset  . Diabetes Mother   . Hypertension Mother   . Diabetes Father   . Hypertension Father    History  Substance Use Topics  . Smoking status: Never Smoker   . Smokeless tobacco: Not on file  . Alcohol Use: No   OB History   Grav Para Term Preterm Abortions TAB SAB Ect Mult Living   1              Review of Systems  Constitutional: Negative for fever and chills.  Musculoskeletal: Positive for myalgias, joint swelling and arthralgias.  Skin: Negative for wound.    Allergies  Review of patient's allergies indicates no known allergies.  Home Medications   Current Outpatient Rx  Name  Route  Sig  Dispense  Refill  . ibuprofen (ADVIL,MOTRIN) 200 MG tablet   Oral   Take 200-400 mg by mouth every 6 (six) hours as needed for pain.           BP 139/80  Pulse 89  Temp(Src) 98.9 F (37.2 C) (Oral)  Resp 20  SpO2 96%  LMP 02/28/2013 Physical Exam  Constitutional:  She is oriented to person, place, and time. She appears well-developed and well-nourished. No distress.  HENT:  Head: Normocephalic and atraumatic.  Right Ear: External ear normal.  Left Ear: External ear normal.  Nose: Nose normal.  Eyes: Conjunctivae are normal.  Neck: Neck supple.  Pulmonary/Chest: Effort normal.  Musculoskeletal:       Right wrist: She exhibits tenderness, bony tenderness and swelling. She exhibits normal range of motion, no effusion, no crepitus, no deformity and no laceration.       Right hand: She exhibits tenderness, bony tenderness and swelling. She exhibits normal range of motion, normal capillary refill, no deformity and no laceration. Normal strength noted.       Hands: Endorses decreased sensation to area of marked on graphical documentation.  Neurological: She is alert and oriented to person, place, and time.  Skin: Skin is warm and dry. She is not diaphoretic.  Psychiatric: She has a normal mood and affect.    ED Course  Procedures (including critical care time) Medications  HYDROcodone-acetaminophen (NORCO/VICODIN) 5-325 MG per tablet 1 tablet (1 tablet Oral Given 09/22/13 1045)    Labs Review Labs Reviewed - No data to display Imaging Review Dg Hand Complete Right  09/22/2013   CLINICAL DATA:  Pain and swelling. Fall  EXAM: RIGHT HAND - COMPLETE 3+ VIEW  COMPARISON:  None  FINDINGS: Angulated fracture of the distal 5th metacarpal. No other fracture or degenerative change.  IMPRESSION: Fracture 5th metacarpal with angulation.   Electronically Signed   By: Marlan Palau M.D.   On: 09/22/2013 10:42    MDM   1. Closed disp fracture of base of fifth metacarpal bone of right hand, initial encounter     11:12 AM Discussed case with Dr. Izora Ribas who recommends Ulnar Guttar Splint and follow up in 1 week in his office.   Afebrile, NAD, non-toxic appearing, AAOx4. Neurovascularly intact. Mild decreased sensation as marked above. Patient X-Ray positive for a  closed boxer's fracture. Pain managed in ED. Discussed with Dr. Izora Ribas who requests follow up in his office along with ulnar gutter splint. Pain medication indicated. Symptomatic care and return precautions discussed with patient. Patient agreeable to plan. Patient stable at time of discharge. Patient d/w with Dr. Radford Pax, agrees with plan.      Lise Auer Araceli Arango, PA-C 09/22/13 1210

## 2013-09-22 NOTE — ED Notes (Signed)
Pt escorted to discharge window. Pt verbalized understanding discharge instructions. In no acute distress.  

## 2013-09-23 NOTE — ED Provider Notes (Signed)
Medical screening examination/treatment/procedure(s) were performed by non-physician practitioner and as supervising physician I was immediately available for consultation/collaboration.   Nelia Shi, MD 09/23/13 1128

## 2013-10-25 IMAGING — US US OB COMP LESS 14 WK
1 series · 14 of 28 positions shown · non-contrast
Comparison: None.

CLINICAL DATA: Vaginal bleeding.

EXAM:
OBSTETRIC <14 WK US AND TRANSVAGINAL OB US
TECHNIQUE: Both transabdominal and transvaginal ultrasound examinations were
performed for complete evaluation of the gestation as well as the
maternal uterus, adnexal regions, and pelvic cul-de-sac.
Transvaginal technique was performed to assess early pregnancy.

[Series 1: us ob comp less 14 wk · 0.22mm/px · 14 of 76 slices shown]
[im 3/76]
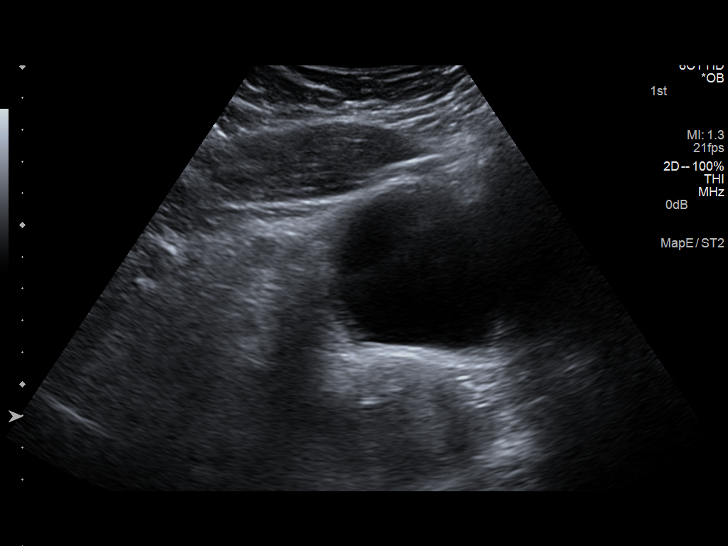
[im 9/76]
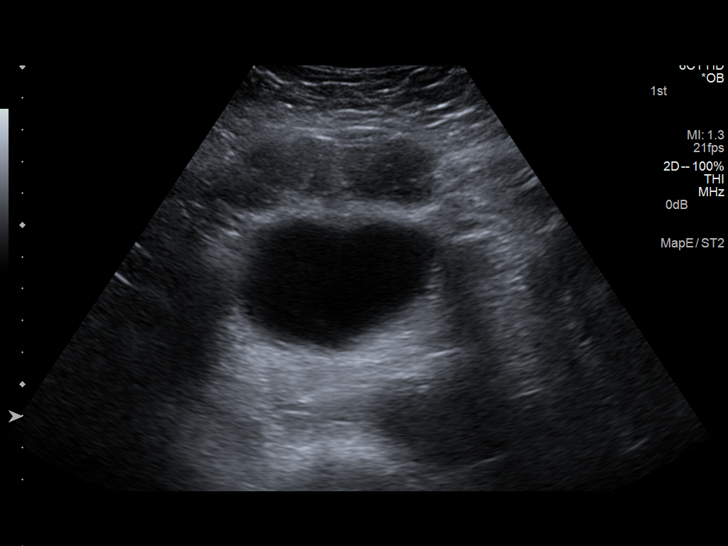
[im 14/76]
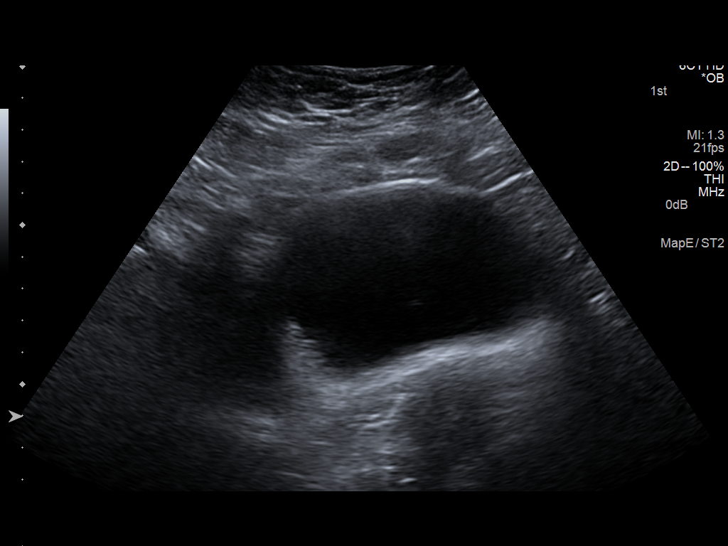
[im 20/76]
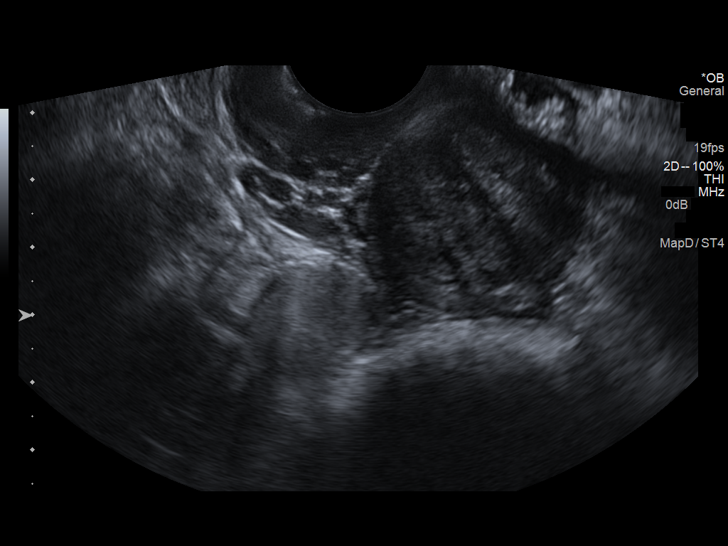
[im 26/76]
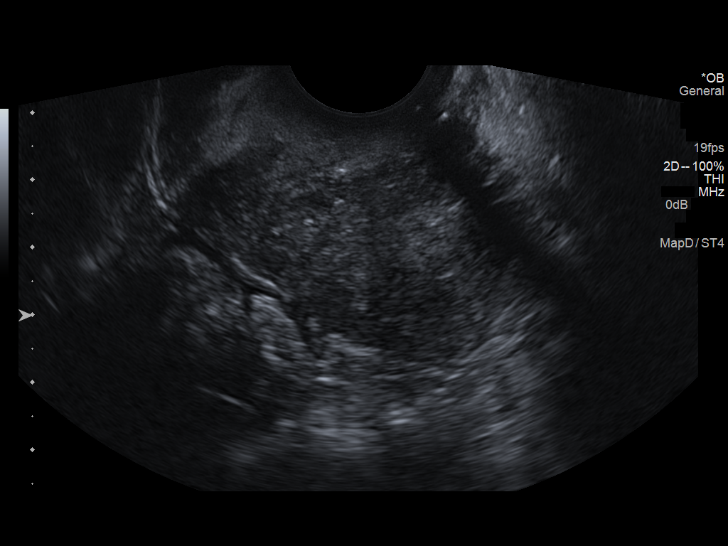
[im 31/76]
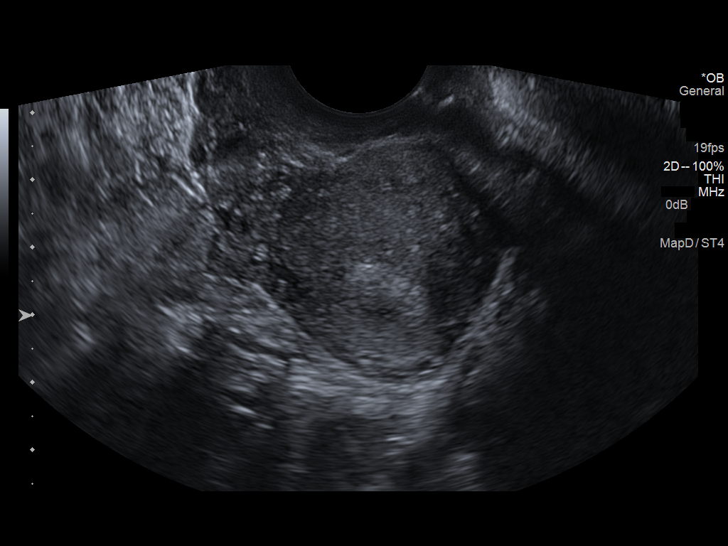
[im 37/76]
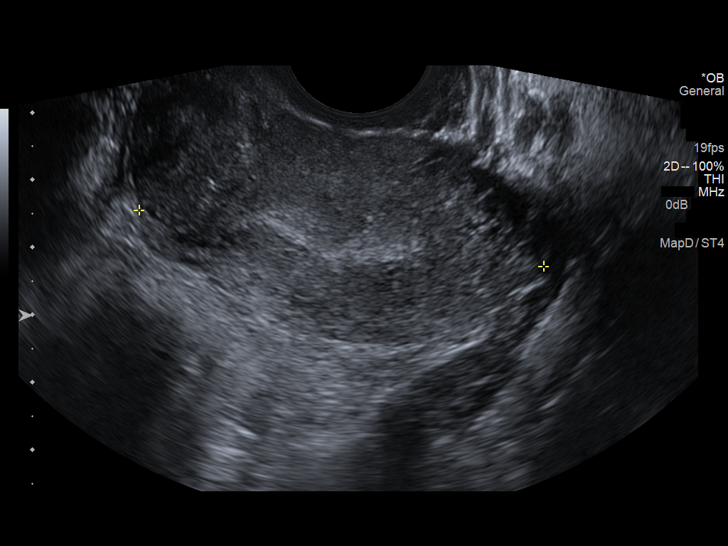
[im 42/76]
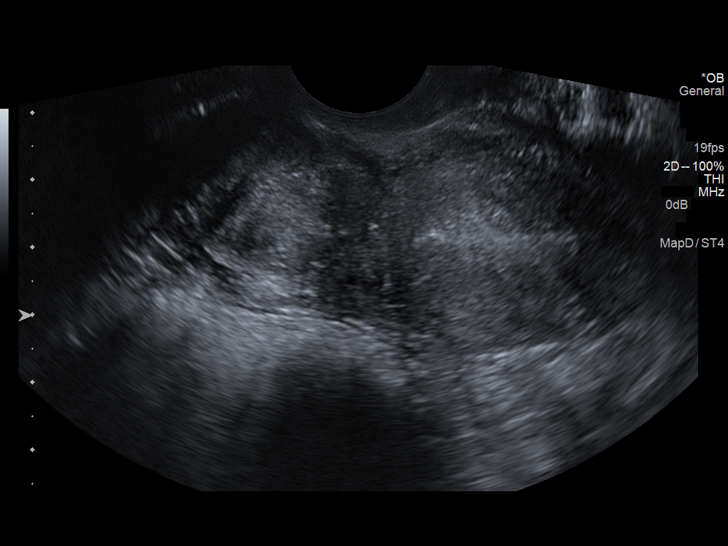
[im 48/76]
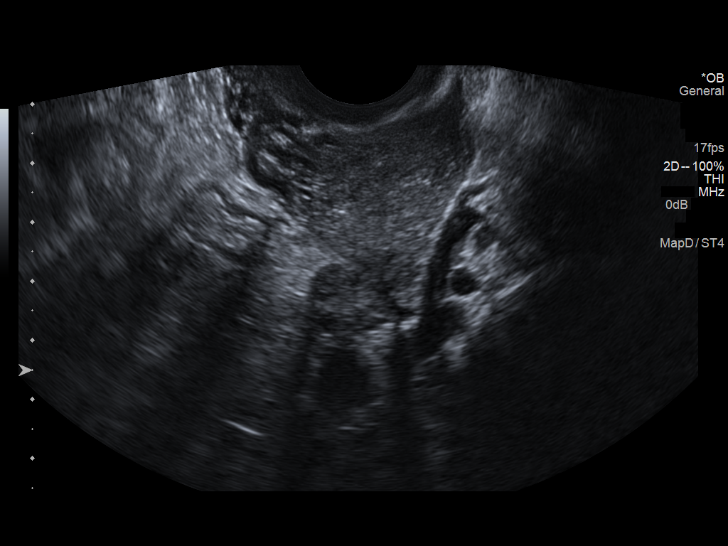
[im 53/76]
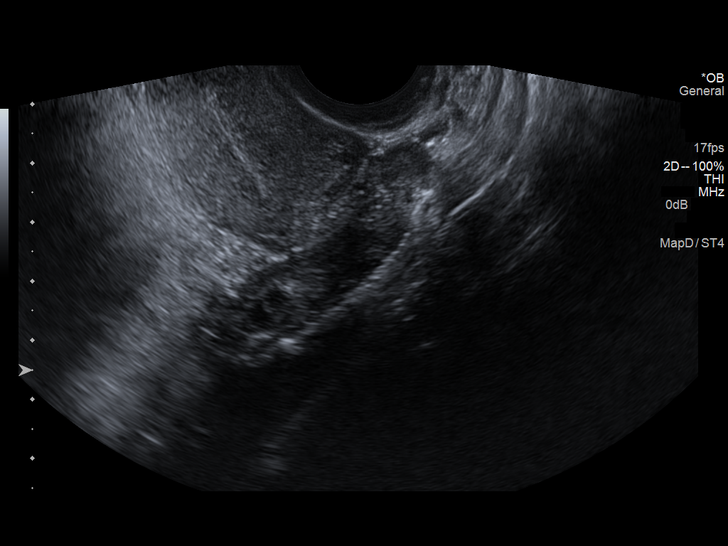
[im 59/76]
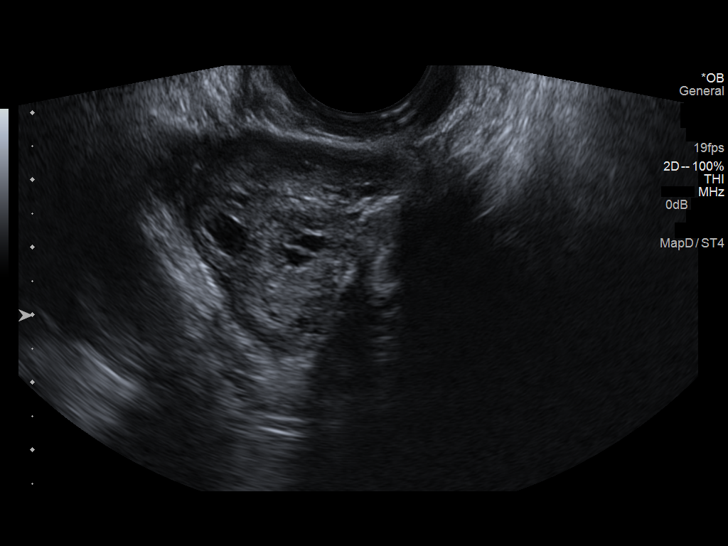
[im 64/76]
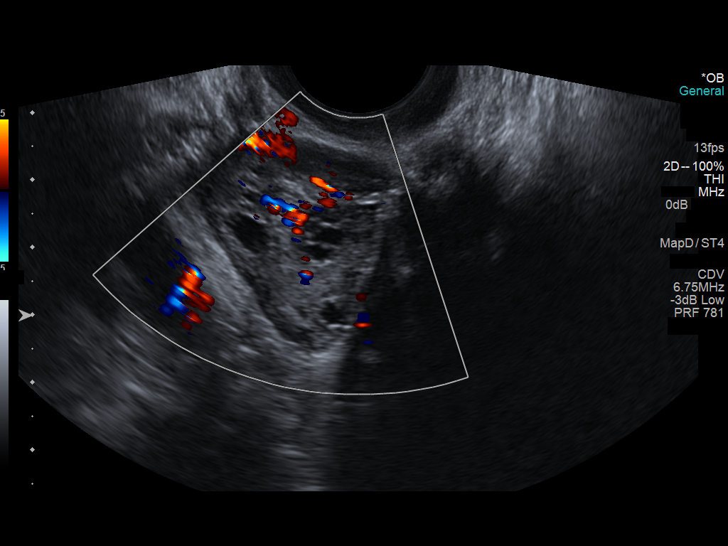
[im 70/76]
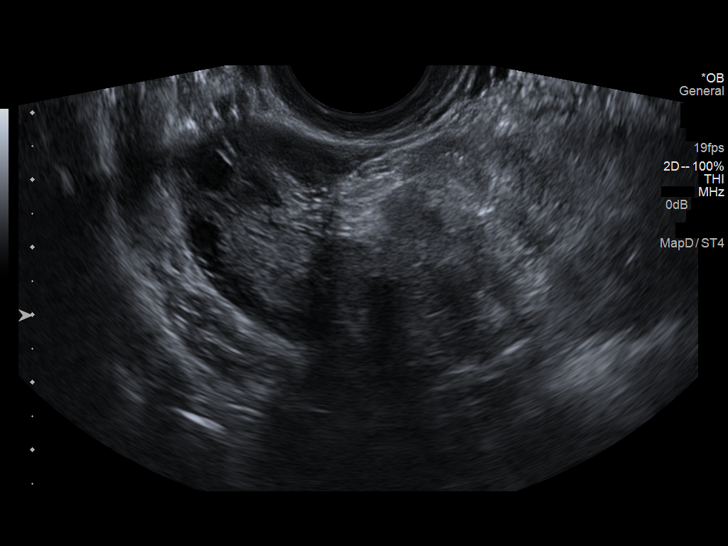
[im 76/76]
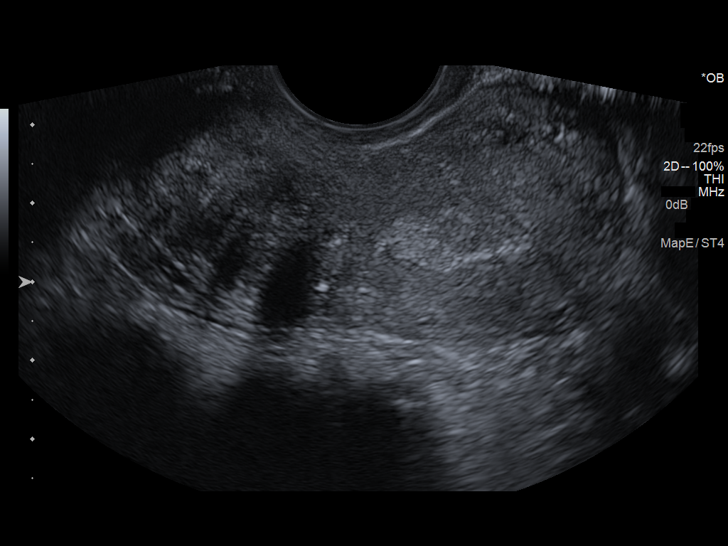

[14 of 28 positions shown; findings below may reference images not displayed]

FINDINGS: Intrauterine gestational sac: Absent

Yolk sac:  Absent

Embryo:  Absent

Cardiac Activity: N/A

Heart Rate:  N/A

Maternal uterus/adnexae: 1 4 cm fibroid in the right side of the
uterus. Endometrium 9 mm in thickness. Uterus is retroverted.

Ovaries are symmetric in size and echotexture. No adnexal masses. No
free fluid.
IMPRESSION: No intrauterine pregnancy. With a pending quantitative HCG,
differential considerations would include intrauterine pregnancy too
early to visualize, spontaneous abortion, or occult ectopic
pregnancy. Recommend correlation with quantitative beta HCGs and
followup.

## 2013-11-02 ENCOUNTER — Other Ambulatory Visit: Payer: Self-pay

## 2013-12-31 ENCOUNTER — Encounter (HOSPITAL_COMMUNITY): Payer: Self-pay | Admitting: Emergency Medicine

## 2013-12-31 ENCOUNTER — Inpatient Hospital Stay (HOSPITAL_COMMUNITY)
Admission: AD | Admit: 2013-12-31 | Discharge: 2014-01-04 | DRG: 885 | Disposition: A | Discharge status: Home / Self Care | Payer: No Typology Code available for payment source | Source: Intra-hospital | Attending: Psychiatry | Admitting: Psychiatry

## 2013-12-31 ENCOUNTER — Emergency Department (HOSPITAL_COMMUNITY)
Admission: EM | Admit: 2013-12-31 | Discharge: 2013-12-31 | Disposition: A | Discharge status: Other Acute Inpatient Hospital | Payer: BC Managed Care – PPO | Attending: Emergency Medicine | Admitting: Emergency Medicine

## 2013-12-31 ENCOUNTER — Encounter (HOSPITAL_COMMUNITY): Payer: Self-pay | Admitting: General Practice

## 2013-12-31 DIAGNOSIS — F3289 Other specified depressive episodes: Secondary | ICD-10-CM | POA: Insufficient documentation

## 2013-12-31 DIAGNOSIS — F411 Generalized anxiety disorder: Secondary | ICD-10-CM | POA: Diagnosis present

## 2013-12-31 DIAGNOSIS — F329 Major depressive disorder, single episode, unspecified: Secondary | ICD-10-CM

## 2013-12-31 DIAGNOSIS — T50992A Poisoning by other drugs, medicaments and biological substances, intentional self-harm, initial encounter: Secondary | ICD-10-CM | POA: Insufficient documentation

## 2013-12-31 DIAGNOSIS — X838XXA Intentional self-harm by other specified means, initial encounter: Secondary | ICD-10-CM

## 2013-12-31 DIAGNOSIS — Z79899 Other long term (current) drug therapy: Secondary | ICD-10-CM | POA: Insufficient documentation

## 2013-12-31 DIAGNOSIS — T483X4A Poisoning by antitussives, undetermined, initial encounter: Secondary | ICD-10-CM | POA: Insufficient documentation

## 2013-12-31 DIAGNOSIS — R45851 Suicidal ideations: Secondary | ICD-10-CM

## 2013-12-31 DIAGNOSIS — F332 Major depressive disorder, recurrent severe without psychotic features: Principal | ICD-10-CM | POA: Diagnosis present

## 2013-12-31 DIAGNOSIS — F32A Depression, unspecified: Secondary | ICD-10-CM

## 2013-12-31 DIAGNOSIS — E119 Type 2 diabetes mellitus without complications: Secondary | ICD-10-CM | POA: Diagnosis present

## 2013-12-31 DIAGNOSIS — I10 Essential (primary) hypertension: Secondary | ICD-10-CM | POA: Diagnosis present

## 2013-12-31 DIAGNOSIS — T1491XA Suicide attempt, initial encounter: Secondary | ICD-10-CM

## 2013-12-31 DIAGNOSIS — F4310 Post-traumatic stress disorder, unspecified: Secondary | ICD-10-CM | POA: Diagnosis present

## 2013-12-31 HISTORY — DX: Depression, unspecified: F32.A

## 2013-12-31 HISTORY — DX: Major depressive disorder, single episode, unspecified: F32.9

## 2013-12-31 HISTORY — DX: Anxiety disorder, unspecified: F41.9

## 2013-12-31 LAB — RAPID URINE DRUG SCREEN, HOSP PERFORMED
Amphetamines: NOT DETECTED
BENZODIAZEPINES: NOT DETECTED
Barbiturates: NOT DETECTED
Cocaine: NOT DETECTED
Opiates: NOT DETECTED
Tetrahydrocannabinol: NOT DETECTED

## 2013-12-31 LAB — CBC
HCT: 39.3 % (ref 36.0–46.0)
HEMOGLOBIN: 13.4 g/dL (ref 12.0–15.0)
MCH: 26.4 pg (ref 26.0–34.0)
MCHC: 34.1 g/dL (ref 30.0–36.0)
MCV: 77.4 fL — ABNORMAL LOW (ref 78.0–100.0)
Platelets: 284 10*3/uL (ref 150–400)
RBC: 5.08 MIL/uL (ref 3.87–5.11)
RDW: 13.9 % (ref 11.5–15.5)
WBC: 11.6 10*3/uL — AB (ref 4.0–10.5)

## 2013-12-31 LAB — COMPREHENSIVE METABOLIC PANEL
ALK PHOS: 109 U/L (ref 39–117)
ALT: 29 U/L (ref 0–35)
AST: 20 U/L (ref 0–37)
Albumin: 4 g/dL (ref 3.5–5.2)
BILIRUBIN TOTAL: 0.2 mg/dL — AB (ref 0.3–1.2)
BUN: 10 mg/dL (ref 6–23)
CHLORIDE: 103 meq/L (ref 96–112)
CO2: 24 meq/L (ref 19–32)
Calcium: 9.6 mg/dL (ref 8.4–10.5)
Creatinine, Ser: 0.76 mg/dL (ref 0.50–1.10)
GLUCOSE: 126 mg/dL — AB (ref 70–99)
POTASSIUM: 3.9 meq/L (ref 3.7–5.3)
SODIUM: 140 meq/L (ref 137–147)
Total Protein: 8.8 g/dL — ABNORMAL HIGH (ref 6.0–8.3)

## 2013-12-31 LAB — POCT PREGNANCY, URINE: Preg Test, Ur: NEGATIVE

## 2013-12-31 LAB — ACETAMINOPHEN LEVEL: Acetaminophen (Tylenol), Serum: <15 ug/mL (ref 10–30)

## 2013-12-31 LAB — SALICYLATE LEVEL

## 2013-12-31 LAB — ETHANOL: Alcohol, Ethyl (B): <11 mg/dL (ref 0–11)

## 2013-12-31 MED ORDER — IBUPROFEN 200 MG PO TABS: 200.0000 mg | ORAL_TABLET | Freq: Four times a day (QID) | ORAL | Status: DC | PRN

## 2013-12-31 MED ORDER — LORAZEPAM 1 MG PO TABS
1.0000 mg | ORAL_TABLET | Freq: Three times a day (TID) | ORAL | Status: DC | PRN
  Administered 2013-12-31: 1 mg via ORAL
  Filled 2013-12-31: qty 1

## 2013-12-31 MED ORDER — ALUM & MAG HYDROXIDE-SIMETH 200-200-20 MG/5ML PO SUSP: 30.0000 mL | ORAL | Status: DC | PRN

## 2013-12-31 MED ORDER — ACETAMINOPHEN 325 MG PO TABS: 650.0000 mg | ORAL_TABLET | ORAL | Status: DC | PRN

## 2013-12-31 MED ORDER — METOPROLOL TARTRATE 12.5 MG HALF TABLET
12.5000 mg | ORAL_TABLET | Freq: Every day | ORAL | Status: DC
  Administered 2013-12-31 – 2014-01-04 (×5): 12.5 mg via ORAL
  Filled 2013-12-31 (×7): qty 1

## 2013-12-31 MED ORDER — METFORMIN HCL 500 MG PO TABS
500.0000 mg | ORAL_TABLET | Freq: Two times a day (BID) | ORAL | Status: DC
  Administered 2013-12-31: 500 mg via ORAL
  Filled 2013-12-31 (×3): qty 1

## 2013-12-31 MED ORDER — ONDANSETRON HCL 4 MG PO TABS: 4.0000 mg | ORAL_TABLET | Freq: Three times a day (TID) | ORAL | Status: DC | PRN

## 2013-12-31 MED ORDER — METFORMIN HCL 500 MG PO TABS
500.0000 mg | ORAL_TABLET | Freq: Two times a day (BID) | ORAL | Status: DC
  Administered 2013-12-31 – 2014-01-04 (×9): 500 mg via ORAL
  Filled 2013-12-31 (×13): qty 1

## 2013-12-31 MED ORDER — SERTRALINE HCL 50 MG PO TABS
25.0000 mg | ORAL_TABLET | Freq: Every day | ORAL | Status: DC
  Administered 2013-12-31: 25 mg via ORAL
  Filled 2013-12-31: qty 1

## 2013-12-31 MED ORDER — SERTRALINE HCL 25 MG PO TABS
25.0000 mg | ORAL_TABLET | Freq: Every day | ORAL | Status: DC
  Administered 2013-12-31 – 2014-01-02 (×3): 25 mg via ORAL
  Filled 2013-12-31 (×6): qty 1

## 2013-12-31 MED ORDER — ONDANSETRON HCL 4 MG PO TABS
4.0000 mg | ORAL_TABLET | Freq: Three times a day (TID) | ORAL | Status: DC | PRN
Start: 2013-12-31 — End: 2013-12-31

## 2013-12-31 MED ORDER — CLONAZEPAM 1 MG PO TABS
1.0000 mg | ORAL_TABLET | Freq: Once | ORAL | Status: AC
  Administered 2013-12-31: 1 mg via ORAL
  Filled 2013-12-31: qty 1

## 2013-12-31 MED ORDER — ZOLPIDEM TARTRATE 5 MG PO TABS: 5.0000 mg | ORAL_TABLET | Freq: Every evening | ORAL | Status: DC | PRN

## 2013-12-31 MED ORDER — MAGNESIUM HYDROXIDE 400 MG/5ML PO SUSP: 30.0000 mL | Freq: Every day | ORAL | Status: DC | PRN

## 2013-12-31 MED ORDER — TRAZODONE HCL 50 MG PO TABS
50.0000 mg | ORAL_TABLET | Freq: Every evening | ORAL | Status: DC | PRN
  Administered 2013-12-31 – 2014-01-03 (×7): 50 mg via ORAL
  Filled 2013-12-31 (×6): qty 1
  Filled 2013-12-31: qty 28
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
## 2013-12-31 NOTE — ED Notes (Signed)
[facility] after 11:30 am

## 2013-12-31 NOTE — BH Assessment (Signed)
[facility] Maryjean Mornharles Kober PA pending a bed, [facility] however patients BP will need to be assessed. Rosemarie BeathMichelle Sadler [facility] made aware.

## 2013-12-31 NOTE — ED Notes (Signed)
Patient asleep; no s/s of distress noted. Respirations regular and unlabored.

## 2013-12-31 NOTE — Consult Note (Signed)
  Contacted by TTS/Rita regarding patient who is actively suicidal with breakup today.This nearly identitical to [facility]un[facility].Labs appear acceptable but UDS is still pending. [facility] and seek placement in meantime.

## 2013-12-31 NOTE — ED Notes (Addendum)
Pt has clothing, shoes, cellphone, and ring.    Pt has been seen and searched by security.

## 2013-12-31 NOTE — [facility] Counselor (Signed)
Writer spoke with Dr. Norlene Campbelltter to get clinicals prior to tele-assessment. Denice BorsRita Nickens-Silva, LCAS, [facility] 12/31/2013 3:56 AM
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
## 2013-12-31 NOTE — ED Notes (Signed)
TTS in progress 

## 2013-12-31 NOTE — Progress Notes (Signed)
Adult Psychoeducational Group Note  Date:  12/31/2013 Time:  10:09 PM  Group Topic/Focus:  AA group  Participation Level:  Active  Participation Quality:  Appropriate  Affect:  Appropriate  Cognitive:  Alert  Insight: Appropriate  Engagement in Group:  Engaged  Modes of Intervention:  Discussion  Additional Comments:    Donney Caraveo R 12/31/2013, 10:09 PM 
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
## 2013-12-31 NOTE — ED Notes (Signed)
Patient states that she is emotional drained, however earlier this evening she had thoughts of hurting her self. The patient reports an emotional breakdown.

## 2013-12-31 NOTE — Tx Team (Signed)
Initial Interdisciplinary Treatment Plan  PATIENT STRENGTHS: (choose at least two) Average or above average intelligence General fund of knowledge  PATIENT STRESSORS: Financial difficulties Loss of boyfriend   PROBLEM LIST: Problem List/Patient Goals Date to be addressed Date deferred Reason deferred Estimated date of resolution  Depression 12/31/2013                                                      DISCHARGE CRITERIA:  Ability to meet basic life and health needs Improved stabilization in mood, thinking, and/or behavior Safe-care adequate arrangements made  PRELIMINARY DISCHARGE PLAN: Attend aftercare/continuing care group Attend PHP/IOP  PATIENT/FAMIILY INVOLVEMENT: This treatment plan has been presented to and reviewed with the patient, Cathy Manning.  The patient and family have been given the opportunity to ask questions and make suggestions.  Marzetta BoardDopson, Adith Tejada E 12/31/2013, 12:49 PM

## 2013-12-31 NOTE — Progress Notes (Signed)
Adult Psychoeducational Group Note  Date:  12/31/2013 Time:  11:17 PM  Group Topic/Focus:  Personal Choices and Values:   The focus of this group is to help patients assess and explore the importance of values in their lives, how their values affect their decisions, how they express their values and what opposes their expression.  Participation Level:  Active  Participation Quality:  Appropriate  Affect:  Anxious and Appropriate  Cognitive:  Alert  Insight: Appropriate  Engagement in Group:  Engaged  Modes of Intervention:  Activity  Additional Comments:   Patient participated in group ball and questions activity. Patient engaged in group discussion on life choices and personal values. Patient supportive of other patients. Patient shared why she is here during group.   Elvera BickerSquire, AmeLie Hollars 12/31/2013, 11:17 PM

## 2013-12-31 NOTE — Progress Notes (Signed)
1:1 note- Patient is bright and appropriate.Currently with a visitor. Denies SI/HI and contracts for safety. No physical complaints. Reviewed stipulations for 1:1 and stated she could talk to provider with safety plan in the am.  Safety maintained.

## 2013-12-31 NOTE — ED Notes (Signed)
Pt with dull, flat affect endorsing depression. No s/s of distress noted at this time. Pt verbally contracts for safety.

## 2014-01-01 DIAGNOSIS — F332 Major depressive disorder, recurrent severe without psychotic features: Principal | ICD-10-CM

## 2014-01-01 LAB — GLUCOSE, CAPILLARY: GLUCOSE-CAPILLARY: 141 mg/dL — AB (ref 70–99)

## 2014-01-01 MED ORDER — NON FORMULARY: 1.0000 | Status: DC

## 2014-01-01 MED ORDER — IMIQUIMOD 5 % EX CREA
TOPICAL_CREAM | CUTANEOUS | Status: DC
  Filled 2014-01-01 (×7): qty 1

## 2014-01-01 NOTE — Progress Notes (Signed)
Adult Psychoeducational Group Note  Date:  01/01/2014 Time:  10:00AM Group Topic/Focus:  Wellness Toolbox:   The focus of this group is to discuss various aspects of wellness, balancing those aspects and exploring ways to increase the ability to experience wellness.  Patients will create a wellness toolbox for use upon discharge.  Participation Level:  Did Not Attend  Additional Comments: Pt didn't attend group.   Bing PlumeScott, Paiten Boies D 01/01/2014, 10:55 AM

## 2014-01-01 NOTE — [facility] Group Notes (Signed)
[facility] L[facility] After[facility] Discharge Planning Group Note   01/01/2014  8:45 AM  Participation Quality:  Did Not Attend - pt was sleeping in the quiet room  Cathy IvanChelsea Horton, LCSW 01/01/2014 10:01 AM

## 2014-01-01 NOTE — Clinical Social Work Note (Signed)
Pt requested CSW to contact her employer, [employer] [phone]) and let them know pt is in the hospital.  Pt signed consent.  CSW contacted [employer] and notified the manager on duty that pt is in the hospital.    Reyes IvanChelsea Horton, LCSW 01/01/2014  3:02 PM

## 2014-01-01 NOTE — Tx Team (Signed)
Interdisciplinary Treatment Plan Update (Adult)  Date: 01/01/2014  Time Reviewed:  9:45 AM  Progress in Treatment: Attending groups: Yes Participating in groups:  Yes Taking medication as prescribed:  Yes Tolerating medication:  Yes Family/Significant othe contact made: CSW assessing Patient understands diagnosis:  Yes Discussing patient identified problems/goals with staff:  Yes Medical problems stabilized or resolved:  Yes Denies suicidal/homicidal ideation: Yes Issues/concerns per patient self-inventory:  Yes Other:  New problem(s) identified: N/A  Discharge Plan or Barriers: CSW assessing for appropriate referrals.    Reason for Continuation of Hospitalization: Anxiety Depression Medication Stabilization  Comments: N/A  Estimated length of stay: 3-5 days  For review of initial/current patient goals, please see plan of care.  Attendees: Patient:     Family:     Physician:    01/01/2014 10:47 AM   Nursing:    01/01/2014 10:47 AM   Clinical Social Worker:  Najeeb Uptain Horton, LCSW 01/01/2014 10:47 AM   Other:  01/01/2014 10:47 AM   Other:  Heather Smart, LCSWA 01/01/2014 10:47 AM   Other:  Aggie Nwoko, NP 01/01/2014 10:47 AM   Other:     Other:    Other:    Other:    Other:    Other:    Other:     Scribe for Treatment Team:   Horton, Glynn Freas Nicole, 01/01/2014 , 10:47 AM   

## 2014-01-01 NOTE — Progress Notes (Signed)
Recreation Therapy Notes  Date: 01.05.2015 Time: 3:00pm Location: [address] Dayroom   Group Topic: Wellness  Goal Area(s) Addresses:  Patient will define components of whole wellness. Patient will verbalize benefit of whole wellness.  Behavioral Response: Appropriate  Intervention: Mind Map.  Activity: As a group patients were asked to identify and define the eight components of whole wellness - Physical, Mental, Emotional, Social, Environmental, Intellectual, Leisure, and Spiritual. Individually patients were asked to identify ways they are investing in each component of whole wellness.   Education: Wellness, Building control surveyorDischarge Planning, Coping Skills  Education Outcome: Acknowledges understanding   Clinical Observations/Feedback: Patient attended group session, but did not participate in group activity. Patient was observed to sit with her eyes closed and head in hands, in addition to bounce her leg up and down.   Marykay Lexenise L Blaise Palladino, LRT/CTRS  Asusena Sigley L 01/01/2014 5:09 PM

## 2014-01-01 NOTE — [facility] Suicide Risk Assessment (Signed)
[facility] Family/Significant Other Suicide Prevention Education  Suicide Prevention Education:   Patient Refusal for Family/Significant Other Suicide Prevention Education: The patient has refused to provide written consent for family/significant other to be provided Family/Significant Other Suicide Prevention Education during admission and/or prior to discharge.  Physician notified.  CSW provided suicide prevention information with patient.    The suicide prevention education provided includes the following:  Suicide risk factors  Suicide prevention and interventions  National Suicide Hotline telephone number  [facility] assessment telephone number  [facility] Emergency Assistance 911  [facility] and/or Residential Mobile Crisis Unit telephone number   Reyes IvanChelsea Horton, [place]CS[place] 01/01/2014 3:01 PM

## 2014-01-01 NOTE — Progress Notes (Signed)
Adult Psychoeducational Group Note  Date:  01/01/2014 Time: 2000  Group Topic/Focus:  AA  Participation Level:  None  Participation Quality:  None  Affect:  Blunted and Flat  Cognitive:    Insight: None  Engagement in Group:  None  Modes of Intervention:  Education  Additional Comments:    Humberto SealsWhitaker, Krystalyn Kubota Monique 01/01/2014, 11:41 PM
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
## 2014-01-01 NOTE — Progress Notes (Signed)
Patient ID: Merlene MorseLauren Shankle, female   DOB: 1986-12-17, 28 y.o.   MRN: [id number] D)  Has been trying to sleep in the quiet room after issues with roommate, dozing at intervals, no specific c/o's voiced. A)  Will continue to monitor 1:1 for safety R)  Safety maintained at this time.
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
## 2014-01-02 LAB — GLUCOSE, CAPILLARY: GLUCOSE-CAPILLARY: 87 mg/dL (ref 70–99)

## 2014-01-02 MED ORDER — SERTRALINE HCL 50 MG PO TABS
50.0000 mg | ORAL_TABLET | Freq: Every day | ORAL | Status: DC
  Administered 2014-01-03 – 2014-01-04 (×2): 50 mg via ORAL
  Filled 2014-01-02: qty 14
  Filled 2014-01-02 (×3): qty 1

## 2014-01-02 MED ORDER — QUETIAPINE FUMARATE 50 MG PO TABS
50.0000 mg | ORAL_TABLET | Freq: Every day | ORAL | Status: DC
  Administered 2014-01-02 – 2014-01-03 (×2): 50 mg via ORAL
  Filled 2014-01-02: qty 14
  Filled 2014-01-02 (×4): qty 1

## 2014-01-02 NOTE — [facility] Group Notes (Signed)
Adult Psychoeducational Group Note  Date:  01/02/2014 Time:  0900  Group Topic/Focus:  Orientation:   The focus of this group is to educate the patient on the purpose and policies of crisis stabilization and provide a format to answer questions about their admission.  The group details unit policies and expectations of patients while admitted.  Participation Level:  Did Not Attend  Alfonse Sprucehorne, Toba Claudio Brooke 01/02/2014, 2:37 PM

## 2014-01-02 NOTE — Progress Notes (Signed)
[facility] MD Progress Note  01/02/2014 5:15 PM Cathy Manning  MRN:  [id number] Subjective:  States she is feeling extremely depressed. States she has not been able to sleep. She admits she feels very overwhlmed. She is dealing with relationship issues. (She is in bed not spontaneous, mostly reserved, guarded) Diagnosis:   DSM5: Schizophrenia Disorders:  none Obsessive-Compulsive Disorders:  none Trauma-Stressor Disorders:  none Substance/Addictive Disorders:  none Depressive Disorders:  Major Depressive Disorder - Severe (296.23)  Axis I: Anxiety Disorder NOS and Mood Disorder NOS  ADL's:  Intact  Sleep: Poor  Appetite:  Fair  Suicidal Ideation:  Plan:  denies Intent:  denies Means:  denies Homicidal Ideation:  Plan:  denies Intent:  denies Means:  denies AEB (as evidenced by):  Psychiatric Specialty Exam: Review of Systems  Constitutional: Positive for malaise/fatigue.  HENT: Negative.   Eyes: Negative.   Respiratory: Negative.   Cardiovascular: Negative.   Gastrointestinal: Negative.   Genitourinary: Negative.   Musculoskeletal: Negative.   Skin: Negative.   Neurological: Positive for weakness.  Endo/Heme/Allergies: Negative.   Psychiatric/Behavioral: Positive for depression. The patient is nervous/anxious and has insomnia.     Blood pressure 119/79, pulse 67, temperature 97.7 F (36.5 C), temperature source Oral, resp. rate 16, height 5\' 6"  (1.676 m), weight 110.678 kg (244 lb), last menstrual period 12/31/2013.Body mass index is 39.4 kg/(m^2).  General Appearance: Fairly Groomed  Patent attorney::  Minimal  Speech:  soft spoken  Volume:  Decreased  Mood:  Depressed  Affect:  Restricted  Thought Process:  Coherent and Goal Directed  Orientation:  Other:  person, place  Thought Content:  symptoms, events  Suicidal Thoughts:  Yes.  without intent/plan  Homicidal Thoughts:  No  Memory:  Immediate;   Poor Recent;   Poor Remote;   Poor  Judgement:  Fair  Insight:   Lacking  Psychomotor Activity:  Restlessness  Concentration:  Fair  Recall:  Fair  Akathisia:  No  Handed:    AIMS (if indicated):     Assets:  Desire for Improvement  Sleep:  Number of Hours: 0   Current Medications: Current Facility-Administered Medications  Medication Dose Route Frequency Provider Last Rate Last Dose  . acetaminophen (TYLENOL) tablet 650 mg  650 mg Oral Q4H PRN Court Joy, PA-C      . alum & mag hydroxide-simeth (MAALOX/MYLANTA) 200-200-20 MG/5ML suspension 30 mL  30 mL Oral PRN Court Joy, PA-C      . ibuprofen (ADVIL,MOTRIN) tablet 200-400 mg  200-400 mg Oral Q6H PRN Court Joy, PA-C      . imiquimod Mathis Dad) 5 % cream   Topical Custom Kristeen Mans, NP      . magnesium hydroxide (MILK OF MAGNESIA) suspension 30 mL  30 mL Oral Daily PRN Court Joy, PA-C      . metFORMIN (GLUCOPHAGE) tablet 500 mg  500 mg Oral BID WC Court Joy, PA-C   500 mg at 01/02/14 0750  . metoprolol tartrate (LOPRESSOR) tablet 12.5 mg  12.5 mg Oral Daily Nanine Means, NP   12.5 mg at 01/02/14 0749  . ondansetron (ZOFRAN) tablet 4 mg  4 mg Oral Q8H PRN Court Joy, PA-C      . sertraline (ZOLOFT) tablet 25 mg  25 mg Oral Daily Shuvon Rankin, NP   25 mg at 01/02/14 0750  . traZODone (DESYREL) tablet 50 mg  50 mg Oral QHS PRN,MR X 1 Court Joy, PA-C   50 mg  at 01/02/14 0111    Lab Results:  Results for orders placed during the hospital encounter of 12/31/13 (from the past 48 hour(s))  GLUCOSE, CAPILLARY     Status: Abnormal   Collection Time    01/01/14  4:53 PM      Result Value Range   Glucose-Capillary 141 (*) 70 - 99 mg/dL   Comment 1 Notify RN    GLUCOSE, CAPILLARY     Status: None   Collection Time    01/02/14  6:22 AM      Result Value Range   Glucose-Capillary 87  70 - 99 mg/dL    Physical Findings: AIMS: Facial and Oral Movements Muscles of Facial Expression: None, normal Lips and Perioral Area: None, normal Jaw: None, normal Tongue:  None, normal,Extremity Movements Upper (arms, wrists, hands, fingers): None, normal Lower (legs, knees, ankles, toes): None, normal, Trunk Movements Neck, shoulders, hips: None, normal, Overall Severity Severity of abnormal movements (highest score from questions above): None, normal Incapacitation due to abnormal movements: None, normal Patient's awareness of abnormal movements (rate only patient's report): No Awareness, Dental Status Current problems with teeth and/or dentures?: Yes (braces) Does patient usually wear dentures?: No  CIWA:    COWS:     Treatment Plan Summary: Daily contact with patient to assess and evaluate symptoms and progress in treatment Medication management  Plan:  Medical Decision Making Problem Points:  Review of psycho-social stressors (1) Data Points:  Review of medication regiment & side effects (2)  I certify that inpatient services furnished can reasonably be expected to improve the patient's condition.   Cathy Manning A 01/02/2014, 5:15 PM
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
## 2014-01-02 NOTE — Progress Notes (Signed)
Recreation Therapy Notes  Animal-Assisted Activity/Therapy (AAA/T) Program Checklist/Progress Notes Patient Eligibility Criteria Checklist & Daily Group note for Rec Tx Intervention  Date: 01.06.2015 Time: 2:45pm Location: [address] Dayroom    AAA/T Program Assumption of Risk Form signed by Patient/ or Parent Legal Guardian yes  Patient is free of allergies or sever asthma yes  Patient reports no fear of animals yes  Patient reports no history of cruelty to animals yes   Patient understands his/her participation is voluntary yes  Patient washes hands before animal contact yes  Patient washes hands after animal contact yes  Behavioral Response: Appropriate, Attentive  Education: Hand Washing, Appropriate Animal Interaction   Education Outcome: Acknowledges understanding   Clinical Observations/Feedback: Patient interacted appropriately with therapy dog team and peers.     [name], LRT/CTRS  Cathy Manning L 01/02/2014 4:50 PM 

## 2014-01-02 NOTE — [facility] Group Notes (Signed)
[facility] LCSW Group Therapy  01/02/2014 4:26 PM  Type of Therapy:  Group Therapy  Participation Level:  Did Not Attend-pt alseep in room   Smart, HeatherLCSWA  01/02/2014, 4:26 PM

## 2014-01-02 NOTE — [facility] Suicide Risk Assessment (Signed)
Suicide Risk Assessment  Admission Assessment     Nursing information obtained from:  Patient Demographic factors:  Adolescent or young adult Current Mental Status:  Suicidal ideation indicated by patient;Self-harm thoughts Loss Factors:  Loss of significant relationship Historical Factors:  Prior suicide attempts Risk Reduction Factors:  Living with another person, especially a relative  CLINICAL FACTORS:   Depression:   Insomnia Severe  COGNITIVE FEATURES THAT CONTRIBUTE TO RISK:  Closed-mindedness Polarized thinking Thought constriction (tunnel vision)    SUICIDE RISK:   Moderate:  Frequent suicidal ideation with limited intensity, and duration, some specificity in terms of plans, no associated intent, good self-control, limited dysphoria/symptomatology, some risk factors present, and identifiable protective factors, including available and accessible social support.  PLAN OF CARE: Supportive approach/coping skills                              CBT;mindfulness                              Optimize treatment with psychotropics  I certify that inpatient services furnished can reasonably be expected to improve the patient's condition.  Ryli Standlee A 01/02/2014, 5:13 PM

## 2014-01-02 NOTE — Progress Notes (Signed)
Recreation Therapy Notes  INPATIENT RECREATION THERAPY ASSESSMENT  Patient Stressors: Family, Relationship, Death, Work  Coping Skills: Isolate, Avoidance, Counselling psychologistelf-Injury,   Leisure Interests: Financial controllerArts & Crafts, Listening to Music, Social Activities, Sports  Personal Challenges: Anger, Communication, Expressing Yourself, Relationships, Self-Esteem/Confidence  Patient indicated she does not have a physical disability that would prevent participating in recreation therapy group sessions.  Patient listed the following strengths: Caring and quick learner  Patient indicated she would like to change the following about herself: caring   Patient listed the following current recreation interests:     Listen to Music    Patient goal for hospitalization:  Get better  [employer] Specialty ChemicalsDenise L Lilyrose Tanney, LRT/CTRS  Jearl KlinefelterBlanchfield, Kellsie Grindle L 01/02/2014 9:03 AM

## 2014-01-03 LAB — GLUCOSE, CAPILLARY: Glucose-Capillary: 85 mg/dL (ref 70–99)

## 2014-01-03 NOTE — Progress Notes (Signed)
Pt reported her sleep as fair. Her appetite good energy normal and ability to pay attention as good.  Depression 4 hopelessness 0 and denied any anxiety on her self-inventory.  Denies any S/H ideations or A/V hallucinations.  Denied any physical problems and has required 0 prn medications.
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
## 2014-01-03 NOTE — Progress Notes (Signed)
Adult Psychoeducational Group Note  Date:  01/03/2014 Time:  9:49 PM  Group Topic/Focus:  NA group  Participation Level:  Active  Participation Quality:  Appropriate  Affect:  Appropriate  Cognitive:  Alert  Insight: Appropriate  Engagement in Group:  Engaged  Modes of Intervention:  Discussion  Additional Comments:    Flonnie HailstoneCOOKE, Guerry Covington R 01/03/2014, 9:49 PM

## 2014-01-03 NOTE — [facility] Group Notes (Signed)
[facility]   01/03/2014  8:45 AM  Participation Quality:  Did Not Attend  Reyes IvanChelsea Horton, LCSW 01/03/2014 9:31 AM

## 2014-01-03 NOTE — Tx Team (Signed)
Interdisciplinary Treatment Plan Update (Adult)  Date: 01/03/2014  Time Reviewed:  9:45 AM  Progress in Treatment: Attending groups: Yes Participating in groups:  Yes Taking medication as prescribed:  Yes Tolerating medication:  Yes Family/Significant othe contact made: No, pt refused Patient understands diagnosis:  Yes Discussing patient identified problems/goals with staff:  Yes Medical problems stabilized or resolved:  Yes Denies suicidal/homicidal ideation: Yes Issues/concerns per patient self-inventory:  Yes Other:  New problem(s) identified: N/A  Discharge Plan or Barriers: [facility] medication management and therapy.    Reason for Continuation of Hospitalization: Anxiety Depression Medication Stabilization  Comments: N/A  Estimated length of stay: 2 days  For review of initial/current patient goals, please see plan of care.  Attendees: Patient:     Family:     Physician:  Dr. Dub MikesLugo 01/03/2014 10:08 AM   Nursing:   Roswell Minersonna Shimp, RN 01/03/2014 10:08 AM   Clinical Social Worker:  Reyes Ivanhelsea Horton, LCSW 01/03/2014 10:08 AM   Other: Onnie BoerJennifer Clark, RN case manager 01/03/2014 10:08 AM   Other:  Trula SladeHeather Smart, LCSWA 01/03/2014 10:08 AM   Other:  Serena ColonelAggie Nwoko, NP 01/03/2014 10:08 AM   Other:  Robbie LouisVivian Kent, RN   Other:    Other:    Other:    Other:    Other:    Other:     Scribe for Treatment Team:   Carmina MillerHorton, Hollye Pritt Nicole, 01/03/2014 , 10:08 AM
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
## 2014-01-03 NOTE — Progress Notes (Signed)
Patient ID: Cathy Manning, female   DOB: 21-Jul-1986, 28 y.o.   MRN: [id number] Pt did not attend group. She was asleep in her bed.

## 2014-01-03 NOTE — Progress Notes (Signed)
Recreation Therapy Notes  Date: 01.07.2014 Time: 2:45pm Location: 500 Hall Dayroom   Group Topic: Communication, Team Building, Problem Solving  Goal Area(s) Addresses:  Patient will effectively work with peer towards shared goal.  Patient will identify skill used to make activity successful.  Patient will identify how skills used during activity can be used to reach post d/c goals.   Behavioral Response: Appropriate   Intervention: Problem Solving Activitiy  Activity: Life Boat. Patients were given a scenario about being on a sinking yacht. Patients were informed the yacht included 15 guest, 8 of which could be placed on the life boat, along with all group members. Individuals on guest list were of varying socioeconomic classes such as a Education officer, museumriest, Materials engineerresident Obama, MidwifeBus Driver, Tree surgeonTeacher and Chef.   Education: Pharmacist, communityocial Skills, Discharge Planning   Education Outcome: Acknowledges Education  Clinical Observations/Feedback: Patient actively engaged in group session, voicing her opinion and debating with peers appropriately. Patient identify qualities that guided her decision making, in addition to importance of skills used during group session. Patient did not verbalize impact skill would have on life post d/c, but nodded in agreement with points raised by other group members.   Marykay Lexenise L Qusay Villada, LRT/CTRS  Linas Stepter L 01/03/2014 5:06 PM

## 2014-01-04 LAB — GLUCOSE, CAPILLARY: Glucose-Capillary: 101 mg/dL — ABNORMAL HIGH (ref 70–99)

## 2014-01-04 MED ORDER — SERTRALINE HCL 50 MG PO TABS: 50.0000 mg | ORAL_TABLET | Freq: Every day | ORAL | Status: AC

## 2014-01-04 MED ORDER — QUETIAPINE FUMARATE 50 MG PO TABS: 50.0000 mg | ORAL_TABLET | Freq: Every day | ORAL | Status: AC

## 2014-01-04 MED ORDER — TRAZODONE HCL 50 MG PO TABS: 50.0000 mg | ORAL_TABLET | Freq: Every evening | ORAL | Status: AC | PRN

## 2014-01-04 MED ORDER — METOPROLOL TARTRATE 12.5 MG HALF TABLET: 12.5000 mg | ORAL_TABLET | Freq: Every day | ORAL | Status: AC

## 2014-01-04 NOTE — Progress Notes (Signed)
Adult Psychoeducational Group Note  Date:  01/04/2014 Time:  3:12 PM  Group Topic/Focus:  Overcoming Stress:   The focus of this group is to define stress and help patients assess their triggers.  Participation Level:  Active  Participation Quality:  Appropriate  Affect:  Appropriate  Cognitive:  Alert and Appropriate  Insight: Appropriate and Good  Engagement in Group:  Engaged  Modes of Intervention:  Discussion, Education and Support  Additional Comments:    Reynolds BowlClement, Praise Dolecki D 01/04/2014, 3:12 PM

## 2014-01-04 NOTE — [facility] Suicide Risk Assessment (Signed)
Suicide Risk Assessment  Discharge Assessment     Demographic Factors:  NA  Mental Status Per Nursing Assessment::   On Admission:  Suicidal ideation indicated by patient;Self-harm thoughts  Current Mental Status by Physician: In full contact with reality. There are no suicidal ideas, plans or intent. Her mood is euthymic, her affect is appropriate. She states she is not going to be with her boyfriend anymore "I am not going to depend on a man to make me happy.". She is going to stay around for couple of weeks and then [place] [facility] [place] her family is.   Loss Factors: Loss of significant relationship  Historical Factors: NA  Risk Reduction Factors:   Employed, Living with another person, especially a relative and Positive social support  Continued Clinical Symptoms:  Depression:   Severe  Cognitive Features That Contribute To Risk:  Polarized thinking Thought constriction (tunnel vision)    Suicide Risk:  Minimal: No identifiable suicidal ideation.  Patients presenting with no risk factors but with morbid ruminations; may be classified as minimal risk based on the severity of the depressive symptoms  Discharge Diagnoses:   AXIS I:  Major Depression, recurrent AXIS II:  Deferred AXIS III:   Past Medical History  Diagnosis Date  . H/O suicide attempt   . Depression   . Anxiety    AXIS IV:  other psychosocial or environmental problems AXIS V:  61-70 mild symptoms  Plan Of Care/Follow-up recommendations:  Activity:  as tolerated Diet:  regular Follow up outpatient basis Is patient on multiple antipsychotic therapies at discharge:  No   Has Patient had three or more failed trials of antipsychotic monotherapy by history:  No  Recommended Plan for Multiple Antipsychotic Therapies: NA  Aribella Vavra A 01/04/2014, 11:51 AM

## 2014-01-04 NOTE — Progress Notes (Signed)
Patient ID: Cathy MorseLauren Cahoon, female   DOB: 1986-09-09, 28 y.o.   MRN: [id number] Pt attended morning wellness group with RN at 9 am. Patient's goal today is to receive closure from her breakup with an ex boyfriend. Pt was informed of the unit rules.
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
## 2014-01-04 NOTE — Progress Notes (Signed)
Recreation Therapy Notes  Animal-Assisted Activity/Therapy (AAA/T) Program Checklist/Progress Notes Patient Eligibility Criteria Checklist & Daily Group note for Rec Tx Intervention  Date: 01.08.2014 Time: 2:45pm Location: [address] Dayroom   AAA/T Program Assumption of Risk Form signed by Patient/ or Parent Legal Guardian yes  Patient is free of allergies or sever asthma yes  Patient reports no fear of animals yes  Patient reports no history of cruelty to animals yes   Patient understands his/her participation is voluntary yes  Patient washes hands before animal contact yes  Patient washes hands after animal contact yes  Behavioral Response: Engaged, Appropriate   Education: Hand Washing, Appropriate Animal Interaction   Education Outcome: Acknowledges understanding   Clinical Observations/Feedback: Patient interacted appropriately with therapy dog team.   Trevino Wyatt L Rodolfo Gaster, LRT/CTRS  Alexandra Posadas L 01/04/2014 5:01 PM 

## 2014-01-04 NOTE — Progress Notes (Signed)
Adult Psychoeducational Group Note  Date:  01/04/2014 Time:  11:57 AM  Group Topic/Focus:  Building Self Esteem:   The Focus of this group is helping patients become aware of the effects of self-esteem on their lives, the things they and others do that enhance or undermine their self-esteem, seeing the relationship between their level of self-esteem and the choices they make and learning ways to enhance self-esteem.  Participation Level:  Active  Participation Quality:  Appropriate  Affect:  Appropriate  Cognitive:  Appropriate  Insight: Appropriate  Engagement in Group:  Engaged  Modes of Intervention:  Discussion, Education and Support  Additional Comments:  Pt attended group and participated.   Subrina Vecchiarelli P 01/04/2014, 11:57 AM

## 2014-01-04 NOTE — Progress Notes (Signed)
Discharged: 01/04/14 2042  Pt was a scheduled discharge for day shift. Writer verified that discharged documents were explained and signed by patient. Pt sent home with medication samples. Pt is denying any suicidal ideation. Pt is also denying any pain at this time. Pt's belongings retrieved and given to pt. Pt had no further questions and concerns for this writer to address. Pt walked out to lobby, where her brother was present to pick her up. Writer verbalized words of support to patient upon discharge.

## 2014-01-04 NOTE — Progress Notes (Addendum)
[facility] :  Will you be returning to the same living situation after discharge: Yes,  returning to brother's home At discharge, do you have transportation home?:Yes,  family or friend will pick pt up today Do you have the ability to pay for your medications:Yes,  access to meds  Release of information consent forms completed and in the chart;  Patient's signature needed at discharge.  Patient to Follow up at: Follow-up Information   Follow up with [facility] On 01/05/2014. (Walk in for hospital discharge appointment, for outpatient medication management. Walk in clinic is Monday - Friday 8 am - 3 pm)    Contact information:   [address] [place], [place] [address] Phone: [phone] Fax: [phone]      Follow up with [facility] On 01/11/2014. (Appointment scheduled at 2:00 pm with Rudi RummageSharon Burkett for therapy)    Contact information:   [address] [place], [place] [address] Phone: [phone] Fax: [phone]      Patient denies SI/HI:   Yes,  denies SI/HI    Safety Planning and Suicide Prevention discussed:  Yes,  discussed with pt.  Pt refused consent for CSW to talk with family/friend.  See suicide prevention education note.    Cathy Manning, Cathy Manning Nicole 01/04/2014, 10:18 AM

## 2014-01-04 NOTE — Progress Notes (Addendum)
D: Pt presents flat in affect and depressed in mood. Pt verbalizes that she is ready to go. She believes that her stay here has been beneficial. However, she rather not be subject to following a set schedule. She is denying any psychosocial symptoms.  A: Writer administered scheduled and prn medications to pt. Continued support and availability as needed was extended to this pt. Staff continue to monitor pt with q6715min checks.  R: No adverse drug reactions noted. Pt receptive to treatment. Pt remains safe at this time.

## 2014-01-09 NOTE — Progress Notes (Signed)
Patient Discharge Instructions:  After Visit Summary (AVS):   Faxed to:  01/09/14 Discharge Summary Note:   Faxed to:  01/09/14 Psychiatric Admission Assessment Note:   Faxed to:  01/09/14 Suicide Risk Assessment - Discharge Assessment:   Faxed to:  01/09/14 Faxed/Sent to the Next Level Care provider:  01/09/14 Faxed to [facility] @ [phone] [facility] @ [phone]  Jerelene ReddenSheena E [place], 01/09/2014, 2:50 PM

## 2014-10-29 ENCOUNTER — Encounter (HOSPITAL_COMMUNITY): Payer: Self-pay | Admitting: General Practice
